# Patient Record
Sex: Female | Born: 1980
Health system: Southern US, Community
[De-identification: ages and names within clinical notes are randomized; demographics above are authoritative.]

## PROBLEM LIST (undated history)

## (undated) DIAGNOSIS — F419 Anxiety disorder, unspecified: Secondary | ICD-10-CM

## (undated) DIAGNOSIS — K589 Irritable bowel syndrome without diarrhea: Secondary | ICD-10-CM

## (undated) DIAGNOSIS — F329 Major depressive disorder, single episode, unspecified: Secondary | ICD-10-CM

## (undated) DIAGNOSIS — K219 Gastro-esophageal reflux disease without esophagitis: Secondary | ICD-10-CM

## (undated) DIAGNOSIS — I10 Essential (primary) hypertension: Secondary | ICD-10-CM

## (undated) DIAGNOSIS — F32A Depression, unspecified: Secondary | ICD-10-CM

## (undated) HISTORY — PX: WISDOM TOOTH EXTRACTION: SHX21

## (undated) HISTORY — PX: ABDOMINAL HYSTERECTOMY: SHX81

## (undated) HISTORY — PX: CHOLECYSTECTOMY: SHX55

## (undated) HISTORY — PX: OOPHORECTOMY: SHX86

---

## 2009-01-08 ENCOUNTER — Ambulatory Visit: Payer: Self-pay | Admitting: Diagnostic Radiology

## 2009-01-08 ENCOUNTER — Emergency Department (HOSPITAL_BASED_OUTPATIENT_CLINIC_OR_DEPARTMENT_OTHER): Admission: EM | Admit: 2009-01-08 | Discharge: 2009-01-09 | Payer: Self-pay | Admitting: Emergency Medicine

## 2009-01-09 ENCOUNTER — Ambulatory Visit: Payer: Self-pay | Admitting: Diagnostic Radiology

## 2009-01-26 ENCOUNTER — Emergency Department (HOSPITAL_BASED_OUTPATIENT_CLINIC_OR_DEPARTMENT_OTHER): Admission: EM | Admit: 2009-01-26 | Discharge: 2009-01-26 | Payer: Self-pay | Admitting: Emergency Medicine

## 2009-04-07 ENCOUNTER — Emergency Department (HOSPITAL_BASED_OUTPATIENT_CLINIC_OR_DEPARTMENT_OTHER): Admission: EM | Admit: 2009-04-07 | Discharge: 2009-04-07 | Payer: Self-pay | Admitting: Emergency Medicine

## 2009-08-25 ENCOUNTER — Emergency Department (HOSPITAL_BASED_OUTPATIENT_CLINIC_OR_DEPARTMENT_OTHER): Admission: EM | Admit: 2009-08-25 | Discharge: 2009-08-25 | Payer: Self-pay | Admitting: Emergency Medicine

## 2010-09-03 LAB — COMPREHENSIVE METABOLIC PANEL
ALT: 17 U/L (ref 0–35)
AST: 19 U/L (ref 0–37)
Alkaline Phosphatase: 69 U/L (ref 39–117)
Creatinine, Ser: 0.7 mg/dL (ref 0.4–1.2)
GFR calc Af Amer: >60 mL/min (ref >60)
GFR calc non Af Amer: >60 mL/min (ref >60)
Glucose, Bld: 94 mg/dL (ref 70–99)
Potassium: 3.6 mEq/L (ref 3.5–5.1)
Sodium: 142 mEq/L (ref 135–145)
Total Protein: 7.3 g/dL (ref 6.0–8.3)

## 2010-09-03 LAB — CBC
Hemoglobin: 14.3 g/dL (ref 12.0–15.0)
MCV: 92.8 fL (ref 78.0–100.0)
Platelets: 274 10*3/uL (ref 150–400)
RBC: 4.5 MIL/uL (ref 3.87–5.11)
RDW: 12 % (ref 11.5–15.5)

## 2010-09-03 LAB — DIFFERENTIAL
Basophils Absolute: 0 10*3/uL (ref 0.0–0.1)
Basophils Relative: 0 % (ref 0–1)
Eosinophils Absolute: 0.2 10*3/uL (ref 0.0–0.7)
Eosinophils Relative: 2 % (ref 0–5)
Monocytes Absolute: 0.6 10*3/uL (ref 0.1–1.0)
Monocytes Relative: 6 % (ref 3–12)
Neutrophils Relative %: 59 % (ref 43–77)

## 2010-09-03 LAB — PREGNANCY, URINE: Preg Test, Ur: NEGATIVE

## 2010-09-03 LAB — URINALYSIS, ROUTINE W REFLEX MICROSCOPIC
Bilirubin Urine: NEGATIVE
Nitrite: NEGATIVE
Protein, ur: NEGATIVE mg/dL
Specific Gravity, Urine: 1.024 (ref 1.005–1.030)
Urobilinogen, UA: 0.2 mg/dL (ref 0.0–1.0)
pH: 5 (ref 5.0–8.0)

## 2010-09-06 LAB — RAPID STREP SCREEN (MED CTR MEBANE ONLY): Streptococcus, Group A Screen (Direct): NEGATIVE

## 2010-09-07 LAB — URINALYSIS, ROUTINE W REFLEX MICROSCOPIC
Glucose, UA: NEGATIVE mg/dL
Hgb urine dipstick: NEGATIVE
Specific Gravity, Urine: 1.043 — ABNORMAL HIGH (ref 1.005–1.030)
pH: 6 (ref 5.0–8.0)

## 2010-09-07 LAB — COMPREHENSIVE METABOLIC PANEL
AST: 18 U/L (ref 0–37)
Albumin: 4.3 g/dL (ref 3.5–5.2)
Alkaline Phosphatase: 82 U/L (ref 39–117)
CO2: 28 mEq/L (ref 19–32)
Calcium: 9.9 mg/dL (ref 8.4–10.5)
Chloride: 106 mEq/L (ref 96–112)
Creatinine, Ser: 0.8 mg/dL (ref 0.4–1.2)
GFR calc non Af Amer: >60 mL/min (ref >60)
Glucose, Bld: 94 mg/dL (ref 70–99)
Total Bilirubin: 0.4 mg/dL (ref 0.3–1.2)
Total Protein: 7.5 g/dL (ref 6.0–8.3)

## 2010-09-07 LAB — CBC
HCT: 41.8 % (ref 36.0–46.0)
Hemoglobin: 14.2 g/dL (ref 12.0–15.0)
MCHC: 34 g/dL (ref 30.0–36.0)
MCV: 91.3 fL (ref 78.0–100.0)
Platelets: 246 10*3/uL (ref 150–400)
RBC: 4.57 MIL/uL (ref 3.87–5.11)
WBC: 11.6 10*3/uL — ABNORMAL HIGH (ref 4.0–10.5)

## 2010-09-07 LAB — DIFFERENTIAL
Basophils Absolute: 0.1 10*3/uL (ref 0.0–0.1)
Basophils Relative: 1 % (ref 0–1)
Eosinophils Absolute: 0.1 10*3/uL (ref 0.0–0.7)
Eosinophils Relative: 1 % (ref 0–5)
Lymphocytes Relative: 35 % (ref 12–46)
Lymphs Abs: 4.1 10*3/uL — ABNORMAL HIGH (ref 0.7–4.0)
Monocytes Absolute: 0.6 10*3/uL (ref 0.1–1.0)
Monocytes Relative: 5 % (ref 3–12)
Neutrophils Relative %: 59 % (ref 43–77)

## 2010-09-07 LAB — LIPASE, BLOOD: Lipase: 156 U/L (ref 23–300)

## 2013-05-29 ENCOUNTER — Encounter (HOSPITAL_BASED_OUTPATIENT_CLINIC_OR_DEPARTMENT_OTHER): Payer: Self-pay | Admitting: Emergency Medicine

## 2013-05-29 ENCOUNTER — Emergency Department (HOSPITAL_BASED_OUTPATIENT_CLINIC_OR_DEPARTMENT_OTHER)
Admission: EM | Admit: 2013-05-29 | Discharge: 2013-05-29 | Disposition: A | Discharge status: Home / Self Care | Payer: Medicare Other | Attending: Emergency Medicine | Admitting: Emergency Medicine

## 2013-05-29 DIAGNOSIS — F411 Generalized anxiety disorder: Secondary | ICD-10-CM | POA: Insufficient documentation

## 2013-05-29 DIAGNOSIS — M26609 Unspecified temporomandibular joint disorder, unspecified side: Secondary | ICD-10-CM | POA: Insufficient documentation

## 2013-05-29 DIAGNOSIS — Z79899 Other long term (current) drug therapy: Secondary | ICD-10-CM | POA: Insufficient documentation

## 2013-05-29 DIAGNOSIS — M26629 Arthralgia of temporomandibular joint, unspecified side: Secondary | ICD-10-CM

## 2013-05-29 DIAGNOSIS — K219 Gastro-esophageal reflux disease without esophagitis: Secondary | ICD-10-CM | POA: Insufficient documentation

## 2013-05-29 DIAGNOSIS — I10 Essential (primary) hypertension: Secondary | ICD-10-CM | POA: Insufficient documentation

## 2013-05-29 HISTORY — DX: Gastro-esophageal reflux disease without esophagitis: K21.9

## 2013-05-29 HISTORY — DX: Irritable bowel syndrome, unspecified: K58.9

## 2013-05-29 HISTORY — DX: Major depressive disorder, single episode, unspecified: F32.9

## 2013-05-29 HISTORY — DX: Depression, unspecified: F32.A

## 2013-05-29 HISTORY — DX: Anxiety disorder, unspecified: F41.9

## 2013-05-29 HISTORY — DX: Essential (primary) hypertension: I10

## 2013-05-29 MED ORDER — IBUPROFEN 800 MG PO TABS: 800.0000 mg | ORAL_TABLET | Freq: Three times a day (TID) | ORAL | Status: DC

## 2013-05-29 NOTE — ED Provider Notes (Signed)
Medical screening examination/treatment/procedure(s) were performed by non-physician practitioner and as supervising physician I was immediately available for consultation/collaboration.  EKG Interpretation   None         Kellan Boehlke, MD 05/29/13 1727 

## 2013-05-29 NOTE — ED Provider Notes (Signed)
CSN: [id number]     Arrival date & time 05/29/13  1527 History   First MD Initiated Contact with Patient 05/29/13 1541     Chief Complaint  Patient presents with  . Otalgia   (Consider location/radiation/quality/duration/timing/severity/associated sxs/prior Treatment) HPI Comments: Pt states that she is having right sided ear pain for 3 days;denies fever, congestion:has used some otc medications without relief  The history is provided by the patient. No language interpreter was used.    Past Medical History  Diagnosis Date  . IBS (irritable bowel syndrome)   . Hypertension   . GERD (gastroesophageal reflux disease)   . Depression   . Anxiety    Past Surgical History  Procedure Laterality Date  . Cholecystectomy    . Abdominal hysterectomy    . Wisdom tooth extraction     No family history on file. History  Substance Use Topics  . Smoking status: Never Smoker   . Smokeless tobacco: Not on file  . Alcohol Use: No   OB History   Grav Para Term Preterm Abortions TAB SAB Ect Mult Living                 Review of Systems  Constitutional: Negative.   HENT: Positive for ear pain.   Respiratory: Negative.   Cardiovascular: Negative.     Allergies  Review of patient's allergies indicates no known allergies.  Home Medications   Current Outpatient Rx  Name  Route  Sig  Dispense  Refill  . ClonazePAM (KLONOPIN PO)   Oral   Take by mouth.         . DOXEPIN HCL PO   Oral   Take by mouth.         . Misc Natural Products (APPLE CIDER VINEGAR) TABS   Oral   Take by mouth.         . Nebivolol HCl (BYSTOLIC PO)   Oral   Take by mouth.         . OMEPRAZOLE PO   Oral   Take by mouth.          BP 111/61  Pulse 61  Temp(Src) 97.6 F (36.4 C) (Oral)  Resp 16  Ht 4\' 10"  (1.473 m)  Wt 120 lb (54.432 kg)  BMI 25.09 kg/m2  SpO2 100% Physical Exam  Nursing note and vitals reviewed. Constitutional: She appears well-developed and well-nourished.  HENT:   Head: Normocephalic and atraumatic.  Right Ear: External ear normal.  Left Ear: External ear normal.  Tenderness over the tmj joint:pt has tm redness or inflammation:no external abnormality noted  Eyes: Conjunctivae and EOM are normal. Pupils are equal, round, and reactive to light.  Cardiovascular: Normal rate and regular rhythm.   Pulmonary/Chest: Effort normal and breath sounds normal.    ED Course  Procedures (including critical care time) Labs Review Labs Reviewed - No data to display Imaging Review No results found.  EKG Interpretation   None       MDM   1. TMJ tenderness    Will treat and have follow up with dentistry    Teressa Lower, NP 05/29/13 1603

## 2013-05-29 NOTE — ED Notes (Signed)
Right ear ache since 12/26

## 2014-10-14 ENCOUNTER — Emergency Department (HOSPITAL_BASED_OUTPATIENT_CLINIC_OR_DEPARTMENT_OTHER): Payer: Medicare Other

## 2014-10-14 ENCOUNTER — Emergency Department (HOSPITAL_BASED_OUTPATIENT_CLINIC_OR_DEPARTMENT_OTHER)
Admission: EM | Admit: 2014-10-14 | Discharge: 2014-10-14 | Disposition: A | Discharge status: Home / Self Care | Payer: Medicare Other | Attending: Emergency Medicine | Admitting: Emergency Medicine

## 2014-10-14 ENCOUNTER — Encounter (HOSPITAL_BASED_OUTPATIENT_CLINIC_OR_DEPARTMENT_OTHER): Payer: Self-pay | Admitting: Emergency Medicine

## 2014-10-14 DIAGNOSIS — Z3202 Encounter for pregnancy test, result negative: Secondary | ICD-10-CM | POA: Insufficient documentation

## 2014-10-14 DIAGNOSIS — Z91040 Latex allergy status: Secondary | ICD-10-CM | POA: Insufficient documentation

## 2014-10-14 DIAGNOSIS — K219 Gastro-esophageal reflux disease without esophagitis: Secondary | ICD-10-CM | POA: Diagnosis not present

## 2014-10-14 DIAGNOSIS — Z791 Long term (current) use of non-steroidal anti-inflammatories (NSAID): Secondary | ICD-10-CM | POA: Insufficient documentation

## 2014-10-14 DIAGNOSIS — Z9049 Acquired absence of other specified parts of digestive tract: Secondary | ICD-10-CM | POA: Diagnosis not present

## 2014-10-14 DIAGNOSIS — R1032 Left lower quadrant pain: Secondary | ICD-10-CM | POA: Diagnosis present

## 2014-10-14 DIAGNOSIS — F419 Anxiety disorder, unspecified: Secondary | ICD-10-CM | POA: Insufficient documentation

## 2014-10-14 DIAGNOSIS — F329 Major depressive disorder, single episode, unspecified: Secondary | ICD-10-CM | POA: Insufficient documentation

## 2014-10-14 DIAGNOSIS — N390 Urinary tract infection, site not specified: Secondary | ICD-10-CM | POA: Insufficient documentation

## 2014-10-14 DIAGNOSIS — K859 Acute pancreatitis, unspecified: Secondary | ICD-10-CM | POA: Diagnosis not present

## 2014-10-14 DIAGNOSIS — Z90710 Acquired absence of both cervix and uterus: Secondary | ICD-10-CM | POA: Diagnosis not present

## 2014-10-14 DIAGNOSIS — I10 Essential (primary) hypertension: Secondary | ICD-10-CM | POA: Diagnosis not present

## 2014-10-14 DIAGNOSIS — Z79899 Other long term (current) drug therapy: Secondary | ICD-10-CM | POA: Diagnosis not present

## 2014-10-14 LAB — COMPREHENSIVE METABOLIC PANEL
ALT: 11 U/L — AB (ref 14–54)
ANION GAP: 7 (ref 5–15)
AST: 16 U/L (ref 15–41)
Albumin: 4.3 g/dL (ref 3.5–5.0)
Alkaline Phosphatase: 67 U/L (ref 38–126)
BUN: 15 mg/dL (ref 6–20)
CALCIUM: 9.2 mg/dL (ref 8.9–10.3)
CO2: 24 mmol/L (ref 22–32)
Chloride: 108 mmol/L (ref 101–111)
Creatinine, Ser: 0.7 mg/dL (ref 0.44–1.00)
Glucose, Bld: 92 mg/dL (ref 65–99)
Potassium: 3.9 mmol/L (ref 3.5–5.1)
Sodium: 139 mmol/L (ref 135–145)
Total Bilirubin: 0.6 mg/dL (ref 0.3–1.2)
Total Protein: 7.1 g/dL (ref 6.5–8.1)

## 2014-10-14 LAB — URINALYSIS, ROUTINE W REFLEX MICROSCOPIC
Bilirubin Urine: NEGATIVE
GLUCOSE, UA: NEGATIVE mg/dL
HGB URINE DIPSTICK: NEGATIVE
Ketones, ur: NEGATIVE mg/dL
NITRITE: NEGATIVE
PROTEIN: NEGATIVE mg/dL
Specific Gravity, Urine: 1.012 (ref 1.005–1.030)
Urobilinogen, UA: 1 mg/dL (ref 0.0–1.0)
pH: 7.5 (ref 5.0–8.0)

## 2014-10-14 LAB — URINE MICROSCOPIC-ADD ON

## 2014-10-14 LAB — CBC WITH DIFFERENTIAL/PLATELET
Basophils Absolute: 0 10*3/uL (ref 0.0–0.1)
Basophils Relative: 0 % (ref 0–1)
EOS ABS: 0.1 10*3/uL (ref 0.0–0.7)
EOS PCT: 2 % (ref 0–5)
HCT: 39.1 % (ref 36.0–46.0)
Hemoglobin: 13.2 g/dL (ref 12.0–15.0)
Lymphocytes Relative: 36 % (ref 12–46)
Lymphs Abs: 2.3 10*3/uL (ref 0.7–4.0)
MCH: 30.4 pg (ref 26.0–34.0)
MCHC: 33.8 g/dL (ref 30.0–36.0)
MCV: 90.1 fL (ref 78.0–100.0)
MONO ABS: 0.6 10*3/uL (ref 0.1–1.0)
Monocytes Relative: 9 % (ref 3–12)
NEUTROS PCT: 53 % (ref 43–77)
Neutro Abs: 3.5 10*3/uL (ref 1.7–7.7)
PLATELETS: 205 10*3/uL (ref 150–400)
RBC: 4.34 MIL/uL (ref 3.87–5.11)
RDW: 12.2 % (ref 11.5–15.5)
WBC: 6.5 10*3/uL (ref 4.0–10.5)

## 2014-10-14 LAB — PREGNANCY, URINE: PREG TEST UR: NEGATIVE

## 2014-10-14 LAB — LIPASE, BLOOD: Lipase: 703 U/L — ABNORMAL HIGH (ref 22–51)

## 2014-10-14 MED ORDER — ONDANSETRON 4 MG PO TBDP: ORAL_TABLET | ORAL | Status: DC

## 2014-10-14 MED ORDER — HYDROCODONE-ACETAMINOPHEN 5-325 MG PO TABS: 2.0000 | ORAL_TABLET | ORAL | Status: DC | PRN

## 2014-10-14 MED ORDER — IOHEXOL 300 MG/ML  SOLN
50.0000 mL | Freq: Once | INTRAMUSCULAR | Status: AC | PRN
  Administered 2014-10-14: 50 mL via ORAL

## 2014-10-14 MED ORDER — FENTANYL CITRATE (PF) 100 MCG/2ML IJ SOLN
50.0000 ug | Freq: Once | INTRAMUSCULAR | Status: AC
  Administered 2014-10-14: 50 ug via INTRAVENOUS
  Filled 2014-10-14: qty 2

## 2014-10-14 MED ORDER — SODIUM CHLORIDE 0.9 % IV BOLUS (SEPSIS)
1000.0000 mL | Freq: Once | INTRAVENOUS | Status: AC
  Administered 2014-10-14: 1000 mL via INTRAVENOUS

## 2014-10-14 MED ORDER — CEPHALEXIN 500 MG PO CAPS: 500.0000 mg | ORAL_CAPSULE | Freq: Four times a day (QID) | ORAL | Status: DC

## 2014-10-14 MED ORDER — IOHEXOL 300 MG/ML  SOLN
100.0000 mL | Freq: Once | INTRAMUSCULAR | Status: AC | PRN
  Administered 2014-10-14: 100 mL via INTRAVENOUS

## 2014-10-14 NOTE — Discharge Instructions (Signed)
You were evaluated in the ED today for your abdominal pain. Your found to have acute pancreatitis as well as a UTI. You'll be given antibiotics for UTI, pain medicines and antinausea medicines for your pancreatitis. It is important for you to follow a clear liquid diet and avoid alcohol at this time. Please follow-up with [facility] for further evaluation and management of your symptoms. Return to ED for new or worsening symptoms.  Acute Pancreatitis Acute pancreatitis is a disease in which the pancreas becomes suddenly inflamed. The pancreas is a large gland located behind your stomach. The pancreas produces enzymes that help digest food. The pancreas also releases the hormones glucagon and insulin that help regulate blood sugar. Damage to the pancreas occurs when the digestive enzymes from the pancreas are activated and begin attacking the pancreas before being released into the intestine. Most acute attacks last a couple of days and can cause serious complications. Some people become dehydrated and develop low blood pressure. In severe cases, bleeding into the pancreas can lead to shock and can be life-threatening. The lungs, heart, and kidneys may fail. CAUSES  Pancreatitis can happen to anyone. In some cases, the cause is unknown. Most cases are caused by:  Alcohol abuse.  Gallstones. Other less common causes are:  Certain medicines.  Exposure to certain chemicals.  Infection.  Damage caused by an accident (trauma).  Abdominal surgery. SYMPTOMS   Pain in the upper abdomen that may radiate to the back.  Tenderness and swelling of the abdomen.  Nausea and vomiting. DIAGNOSIS  Your caregiver will perform a physical exam. Blood and stool tests may be done to confirm the diagnosis. Imaging tests may also be done, such as X-rays, CT scans, or an ultrasound of the abdomen. TREATMENT  Treatment usually requires a stay in the hospital. Treatment may include:  Pain  medicine.  Fluid replacement through an intravenous line (IV).  Placing a tube in the stomach to remove stomach contents and control vomiting.  Not eating for 3 or 4 days. This gives your pancreas a rest, because enzymes are not being produced that can cause further damage.  Antibiotic medicines if your condition is caused by an infection.  Surgery of the pancreas or gallbladder. HOME CARE INSTRUCTIONS   Follow the diet advised by your caregiver. This may involve avoiding alcohol and decreasing the amount of fat in your diet.  Eat smaller, more frequent meals. This reduces the amount of digestive juices the pancreas produces.  Drink enough fluids to keep your urine clear or pale yellow.  Only take over-the-counter or prescription medicines as directed by your caregiver.  Avoid drinking alcohol if it caused your condition.  Do not smoke.  Get plenty of rest.  Check your blood sugar at home as directed by your caregiver.  Keep all follow-up appointments as directed by your caregiver. SEEK MEDICAL CARE IF:   You do not recover as quickly as expected.  You develop new or worsening symptoms.  You have persistent pain, weakness, or nausea.  You recover and then have another episode of pain. SEEK IMMEDIATE MEDICAL CARE IF:   You are unable to eat or keep fluids down.  Your pain becomes severe.  You have a fever or persistent symptoms for more than 2 to 3 days.  You have a fever and your symptoms suddenly get worse.  Your skin or the white part of your eyes turn yellow (jaundice).  You develop vomiting.  You feel dizzy, or you  faint.  Your blood sugar is high (over 300 mg/dL). MAKE SURE YOU:   Understand these instructions.  Will watch your condition.  Will get help right away if you are not doing well or get worse. Document Released: 05/18/2005 Document Revised: 11/17/2011 Document Reviewed: 08/27/2011 [facility] Patient Information 2015 [place], [place]. This  information is not intended to replace advice given to you by your health care provider. Make sure you discuss any questions you have with your health care provider.  Urinary Tract Infection Urinary tract infections (UTIs) can develop anywhere along your urinary tract. Your urinary tract is your body's drainage system for removing wastes and extra water. Your urinary tract includes two kidneys, two ureters, a bladder, and a urethra. Your kidneys are a pair of bean-shaped organs. Each kidney is about the size of your fist. They are located below your ribs, one on each side of your spine. CAUSES Infections are caused by microbes, which are microscopic organisms, including fungi, viruses, and bacteria. These organisms are so small that they can only be seen through a microscope. Bacteria are the microbes that most commonly cause UTIs. SYMPTOMS  Symptoms of UTIs may vary by age and gender of the patient and by the location of the infection. Symptoms in young women typically include a frequent and intense urge to urinate and a painful, burning feeling in the bladder or urethra during urination. Older women and men are more likely to be tired, shaky, and weak and have muscle aches and abdominal pain. A fever may mean the infection is in your kidneys. Other symptoms of a kidney infection include pain in your back or sides below the ribs, nausea, and vomiting. DIAGNOSIS To diagnose a UTI, your caregiver will ask you about your symptoms. Your caregiver also will ask to provide a urine sample. The urine sample will be tested for bacteria and white blood cells. White blood cells are made by your body to help fight infection. TREATMENT  Typically, UTIs can be treated with medication. Because most UTIs are caused by a bacterial infection, they usually can be treated with the use of antibiotics. The choice of antibiotic and length of treatment depend on your symptoms and the type of bacteria causing your  infection. HOME CARE INSTRUCTIONS  If you were prescribed antibiotics, take them exactly as your caregiver instructs you. Finish the medication even if you feel better after you have only taken some of the medication.  Drink enough water and fluids to keep your urine clear or pale yellow.  Avoid caffeine, tea, and carbonated beverages. They tend to irritate your bladder.  Empty your bladder often. Avoid holding urine for long periods of time.  Empty your bladder before and after sexual intercourse.  After a bowel movement, women should cleanse from front to back. Use each tissue only once. SEEK MEDICAL CARE IF:   You have back pain.  You develop a fever.  Your symptoms do not begin to resolve within 3 days. SEEK IMMEDIATE MEDICAL CARE IF:   You have severe back pain or lower abdominal pain.  You develop chills.  You have nausea or vomiting.  You have continued burning or discomfort with urination. MAKE SURE YOU:   Understand these instructions.  Will watch your condition.  Will get help right away if you are not doing well or get worse. Document Released: 02/25/2005 Document Revised: 11/17/2011 Document Reviewed: 06/26/2011 [facility]ExitCare Patient Information 2015 [place]Care, [place]. This information is not intended to replace advice given to you by  your health care provider. Make sure you discuss any questions you have with your health care provider. ° °

## 2014-10-14 NOTE — ED Notes (Signed)
Patient reports having sharp abdominal pain in the LLQ which began 4 days ago.  Denies N/V.  Reports hx of IBS and has had "some diarrhea".  Reports the pain began upper mid abdomen and has since radiated to LLQ last night.  Reports pain increases with walking.

## 2014-10-14 NOTE — ED Provider Notes (Signed)
CSN: [id number]     Arrival date & time 10/14/14  1155 History   First MD Initiated Contact with Patient 10/14/14 1200     Chief Complaint  Patient presents with  . Abdominal Pain     (Consider location/radiation/quality/duration/timing/severity/associated sxs/prior Treatment) HPI Carla Jackson is a 34 y.o. female with history of IBS, status post total abdominal hysterectomy, comes in for evaluation of lower left quadrant abdominal pain. Patient states she had a "sharp stabbing pain right above my belly button" proximally 4 days ago that has gradually gotten worse. She has taken Aleve without relief. She denies any nausea, vomiting, fevers, chills, urinary symptoms, dark or bloody stools. Reports last bowel movement was yesterday and was normal for her. This discomfort does not feel similar to her IBS discomfort. Symptoms feel better with lying flat and curled up and worsened with walking and certain movements.  Past Medical History  Diagnosis Date  . IBS (irritable bowel syndrome)   . Hypertension   . GERD (gastroesophageal reflux disease)   . Depression   . Anxiety    Past Surgical History  Procedure Laterality Date  . Cholecystectomy    . Abdominal hysterectomy    . Wisdom tooth extraction     History reviewed. No pertinent family history. History  Substance Use Topics  . Smoking status: Never Smoker   . Smokeless tobacco: Not on file  . Alcohol Use: No   OB History    No data available     Review of Systems A 10 point review of systems was completed and was negative except for pertinent positives and negatives as mentioned in the history of present illness     Allergies  Erythromycin and Latex  Home Medications   Prior to Admission medications   Medication Sig Start Date End Date Taking? Authorizing Provider  cephALEXin (KEFLEX) 500 MG capsule Take 1 capsule (500 mg total) by mouth 4 (four) times daily. 10/14/14   Joycie PeekBenjamin Kimba Lottes, PA-C  ClonazePAM (KLONOPIN  PO) Take by mouth.    Historical Provider, MD  DOXEPIN HCL PO Take by mouth.    Historical Provider, MD  HYDROcodone-acetaminophen (NORCO) 5-325 MG per tablet Take 2 tablets by mouth every 4 (four) hours as needed. 10/14/14   Joycie PeekBenjamin Kedric Bumgarner, PA-C  ibuprofen (ADVIL,MOTRIN) 800 MG tablet Take 1 tablet (800 mg total) by mouth 3 (three) times daily. 05/29/13   Teressa LowerVrinda Pickering, NP  Misc Natural Products (APPLE CIDER VINEGAR) TABS Take by mouth.    Historical Provider, MD  Nebivolol HCl (BYSTOLIC PO) Take by mouth.    Historical Provider, MD  OMEPRAZOLE PO Take by mouth.    Historical Provider, MD  ondansetron (ZOFRAN ODT) 4 MG disintegrating tablet 4mg  ODT q4 hours prn nausea/vomit 10/14/14   Oliverio Cho, PA-C   BP 132/70 mmHg  Pulse 78  Temp(Src) 98.5 F (36.9 C) (Oral)  Resp 16  Ht 4\' 10"  (1.473 m)  Wt 120 lb (54.432 kg)  BMI 25.09 kg/m2  SpO2 100% Physical Exam  Constitutional: She is oriented to person, place, and time. She appears well-developed and well-nourished.  HENT:  Head: Normocephalic and atraumatic.  Mouth/Throat: Oropharynx is clear and moist.  Eyes: Conjunctivae are normal. Pupils are equal, round, and reactive to light. Right eye exhibits no discharge. Left eye exhibits no discharge. No scleral icterus.  Neck: Neck supple.  Cardiovascular: Normal rate, regular rhythm and normal heart sounds.   Pulmonary/Chest: Effort normal and breath sounds normal. No respiratory distress. She has no wheezes.  She has no rales.  Abdominal: Soft. There is no tenderness.  Exquisite tenderness to palpation in epigastrium, periumbilical region as well as lower left quadrant. Abdomen is soft, nondistended. Patient does express some guarding. No rebound. No lesions or deformities. No palpable masses.  Musculoskeletal: She exhibits no tenderness.  Neurological: She is alert and oriented to person, place, and time.  Cranial Nerves II-XII grossly intact  Skin: Skin is warm and dry. No rash  noted.  Psychiatric: She has a normal mood and affect.  Nursing note and vitals reviewed.   ED Course  Procedures (including critical care time) Labs Review Labs Reviewed  COMPREHENSIVE METABOLIC PANEL - Abnormal; Notable for the following:    ALT 11 (*)    All other components within normal limits  LIPASE, BLOOD - Abnormal; Notable for the following:    Lipase 703 (*)    All other components within normal limits  URINALYSIS, ROUTINE W REFLEX MICROSCOPIC - Abnormal; Notable for the following:    Leukocytes, UA LARGE (*)    All other components within normal limits  URINE MICROSCOPIC-ADD ON - Abnormal; Notable for the following:    Bacteria, UA MANY (*)    All other components within normal limits  CBC WITH DIFFERENTIAL/PLATELET  PREGNANCY, URINE    Imaging Review Ct Abdomen Pelvis W Contrast  10/14/2014   CLINICAL DATA:  Left lower quadrant abdomen pain for 4 days. History of inflammatory bowel disease.  EXAM: CT ABDOMEN AND PELVIS WITH CONTRAST  TECHNIQUE: Multidetector CT imaging of the abdomen and pelvis was performed using the standard protocol following bolus administration of intravenous contrast.  CONTRAST:  50mL OMNIPAQUE IOHEXOL 300 MG/ML SOLN, 100mL OMNIPAQUE IOHEXOL 300 MG/ML SOLN  COMPARISON:  January 08, 2009  FINDINGS: The liver, spleen, pancreas, adrenal glands are normal. The patient is status post prior cholecystectomy. There is a 3 mm cyst in the lower pole right kidney. The left kidney is normal. There is no hydronephrosis bilaterally. The aorta is normal. There is no abdominal lymphadenopathy.  There is no small bowel obstruction or diverticulitis. The appendix not seen but no inflammation is noted around the cecum.  Fluid-filled bladder is normal. The patient is status post prior hysterectomy. Minimal free fluid is identified in the pelvis. Small cysts are identified in normal size bilateral ovaries. The visualized lung bases are clear. No acute abnormalities identified  within the visualized bones.  IMPRESSION: No acute abnormality identified in the abdomen and pelvis. There is no evidence of bowel obstruction or diverticulitis.   Electronically Signed   By: Sherian ReinWei-Chen  Lin M.D.   On: 10/14/2014 15:17     EKG Interpretation None      MDM  Vitals stable - WNL -afebrile Pt resting comfortably in ED. PE--epigastric and lower left quadrant tenderness that is improving after administration of IV fluids and analgesia. Labwork--lipase 703, evidence of UTI on urinalysis. Labs otherwise noncontributory. Imaging-CT abdomen shows no acute intra-abdominal pathology.  DDX--patient is stable, nontoxic in appearance is that she feels much better after IV fluids and pain medicines in the ED. I feel patient is stable to be discharged home to follow-up outpatient with [facility]. Will DC with instructions for clear fluid diet, pain medicines, antiemetics. Antibiotics for UTI.  I discussed all relevant lab findings and imaging results with pt and they verbalized understanding. Discussed f/u with PCP within 48 hrs and return precautions, pt very amenable to plan. Prior to patient discharge, I discussed and reviewed this case with  Dr. Criss Alvine     Final diagnoses:  Acute pancreatitis, unspecified pancreatitis type  UTI (lower urinary tract infection)        Joycie Peek, PA-C 10/15/14 0028  Eber Hong, MD 10/17/14 [phone]

## 2015-02-26 ENCOUNTER — Other Ambulatory Visit (HOSPITAL_BASED_OUTPATIENT_CLINIC_OR_DEPARTMENT_OTHER): Payer: Self-pay | Admitting: Physician Assistant

## 2015-02-26 DIAGNOSIS — R319 Hematuria, unspecified: Secondary | ICD-10-CM

## 2015-02-26 DIAGNOSIS — R1032 Left lower quadrant pain: Secondary | ICD-10-CM

## 2015-02-28 ENCOUNTER — Ambulatory Visit (HOSPITAL_BASED_OUTPATIENT_CLINIC_OR_DEPARTMENT_OTHER)
Admission: RE | Admit: 2015-02-28 | Discharge: 2015-02-28 | Disposition: A | Discharge status: Home / Self Care | Payer: Medicare Other | Source: Ambulatory Visit | Attending: Physician Assistant | Admitting: Physician Assistant

## 2015-02-28 DIAGNOSIS — Z90710 Acquired absence of both cervix and uterus: Secondary | ICD-10-CM | POA: Diagnosis not present

## 2015-02-28 DIAGNOSIS — R109 Unspecified abdominal pain: Secondary | ICD-10-CM | POA: Diagnosis present

## 2015-02-28 DIAGNOSIS — Z9049 Acquired absence of other specified parts of digestive tract: Secondary | ICD-10-CM | POA: Diagnosis not present

## 2015-02-28 DIAGNOSIS — R319 Hematuria, unspecified: Secondary | ICD-10-CM

## 2015-02-28 DIAGNOSIS — R1032 Left lower quadrant pain: Secondary | ICD-10-CM

## 2015-03-07 ENCOUNTER — Other Ambulatory Visit (HOSPITAL_BASED_OUTPATIENT_CLINIC_OR_DEPARTMENT_OTHER): Payer: Self-pay | Admitting: Physician Assistant

## 2015-03-07 DIAGNOSIS — R1032 Left lower quadrant pain: Secondary | ICD-10-CM

## 2015-03-08 ENCOUNTER — Ambulatory Visit (HOSPITAL_BASED_OUTPATIENT_CLINIC_OR_DEPARTMENT_OTHER): Admission: RE | Admit: 2015-03-08 | Payer: Medicare Other | Source: Ambulatory Visit

## 2015-03-08 ENCOUNTER — Ambulatory Visit (HOSPITAL_BASED_OUTPATIENT_CLINIC_OR_DEPARTMENT_OTHER)
Admission: RE | Admit: 2015-03-08 | Discharge: 2015-03-08 | Disposition: A | Discharge status: Home / Self Care | Payer: Medicare Other | Source: Ambulatory Visit | Attending: Physician Assistant | Admitting: Physician Assistant

## 2015-03-08 DIAGNOSIS — R112 Nausea with vomiting, unspecified: Secondary | ICD-10-CM | POA: Insufficient documentation

## 2015-03-08 DIAGNOSIS — N941 Unspecified dyspareunia: Secondary | ICD-10-CM | POA: Diagnosis not present

## 2015-03-08 DIAGNOSIS — R1032 Left lower quadrant pain: Secondary | ICD-10-CM | POA: Diagnosis not present

## 2015-03-08 DIAGNOSIS — Z90710 Acquired absence of both cervix and uterus: Secondary | ICD-10-CM | POA: Insufficient documentation

## 2015-03-11 ENCOUNTER — Emergency Department (HOSPITAL_BASED_OUTPATIENT_CLINIC_OR_DEPARTMENT_OTHER)
Admission: EM | Admit: 2015-03-11 | Discharge: 2015-03-11 | Disposition: A | Discharge status: Home / Self Care | Payer: Medicare Other | Attending: Emergency Medicine | Admitting: Emergency Medicine

## 2015-03-11 ENCOUNTER — Encounter (HOSPITAL_BASED_OUTPATIENT_CLINIC_OR_DEPARTMENT_OTHER): Payer: Self-pay | Admitting: *Deleted

## 2015-03-11 DIAGNOSIS — I10 Essential (primary) hypertension: Secondary | ICD-10-CM | POA: Diagnosis not present

## 2015-03-11 DIAGNOSIS — Z792 Long term (current) use of antibiotics: Secondary | ICD-10-CM | POA: Insufficient documentation

## 2015-03-11 DIAGNOSIS — R110 Nausea: Secondary | ICD-10-CM | POA: Diagnosis not present

## 2015-03-11 DIAGNOSIS — Z791 Long term (current) use of non-steroidal anti-inflammatories (NSAID): Secondary | ICD-10-CM | POA: Diagnosis not present

## 2015-03-11 DIAGNOSIS — K219 Gastro-esophageal reflux disease without esophagitis: Secondary | ICD-10-CM | POA: Diagnosis not present

## 2015-03-11 DIAGNOSIS — M542 Cervicalgia: Secondary | ICD-10-CM | POA: Insufficient documentation

## 2015-03-11 DIAGNOSIS — R109 Unspecified abdominal pain: Secondary | ICD-10-CM | POA: Diagnosis present

## 2015-03-11 DIAGNOSIS — Z91040 Latex allergy status: Secondary | ICD-10-CM | POA: Insufficient documentation

## 2015-03-11 DIAGNOSIS — M549 Dorsalgia, unspecified: Secondary | ICD-10-CM | POA: Diagnosis not present

## 2015-03-11 DIAGNOSIS — F419 Anxiety disorder, unspecified: Secondary | ICD-10-CM | POA: Diagnosis not present

## 2015-03-11 DIAGNOSIS — F329 Major depressive disorder, single episode, unspecified: Secondary | ICD-10-CM | POA: Diagnosis not present

## 2015-03-11 DIAGNOSIS — N939 Abnormal uterine and vaginal bleeding, unspecified: Secondary | ICD-10-CM | POA: Insufficient documentation

## 2015-03-11 LAB — CBC WITH DIFFERENTIAL/PLATELET
Basophils Absolute: 0 10*3/uL (ref 0.0–0.1)
Basophils Relative: 1 %
Eosinophils Absolute: 0.2 10*3/uL (ref 0.0–0.7)
Eosinophils Relative: 2 %
HCT: 37.3 % (ref 36.0–46.0)
Hemoglobin: 12.6 g/dL (ref 12.0–15.0)
LYMPHS ABS: 4.3 10*3/uL — AB (ref 0.7–4.0)
LYMPHS PCT: 48 %
MCH: 30.2 pg (ref 26.0–34.0)
MCHC: 33.8 g/dL (ref 30.0–36.0)
MCV: 89.4 fL (ref 78.0–100.0)
MONOS PCT: 8 %
Monocytes Absolute: 0.7 10*3/uL (ref 0.1–1.0)
NEUTROS PCT: 41 %
Neutro Abs: 3.7 10*3/uL (ref 1.7–7.7)
Platelets: 231 10*3/uL (ref 150–400)
RBC: 4.17 MIL/uL (ref 3.87–5.11)
RDW: 11.8 % (ref 11.5–15.5)
WBC: 8.9 10*3/uL (ref 4.0–10.5)

## 2015-03-11 LAB — URINE MICROSCOPIC-ADD ON

## 2015-03-11 LAB — COMPREHENSIVE METABOLIC PANEL
ALK PHOS: 62 U/L (ref 38–126)
ALT: 15 U/L (ref 14–54)
ANION GAP: 8 (ref 5–15)
AST: 21 U/L (ref 15–41)
Albumin: 4.3 g/dL (ref 3.5–5.0)
BILIRUBIN TOTAL: 0.4 mg/dL (ref 0.3–1.2)
BUN: 9 mg/dL (ref 6–20)
CALCIUM: 9.1 mg/dL (ref 8.9–10.3)
CO2: 28 mmol/L (ref 22–32)
CREATININE: 0.8 mg/dL (ref 0.44–1.00)
Chloride: 104 mmol/L (ref 101–111)
GFR calc Af Amer: >60 mL/min (ref >60)
GFR calc non Af Amer: >60 mL/min (ref >60)
GLUCOSE: 97 mg/dL (ref 65–99)
Potassium: 3.4 mmol/L — ABNORMAL LOW (ref 3.5–5.1)
Sodium: 140 mmol/L (ref 135–145)
Total Protein: 7.2 g/dL (ref 6.5–8.1)

## 2015-03-11 LAB — URINALYSIS, ROUTINE W REFLEX MICROSCOPIC
BILIRUBIN URINE: NEGATIVE
GLUCOSE, UA: NEGATIVE mg/dL
HGB URINE DIPSTICK: NEGATIVE
Ketones, ur: NEGATIVE mg/dL
Nitrite: NEGATIVE
PH: 7.5 (ref 5.0–8.0)
Protein, ur: NEGATIVE mg/dL
SPECIFIC GRAVITY, URINE: 1.002 — AB (ref 1.005–1.030)
Urobilinogen, UA: 0.2 mg/dL (ref 0.0–1.0)

## 2015-03-11 LAB — LIPASE, BLOOD: Lipase: 34 U/L (ref 22–51)

## 2015-03-11 MED ORDER — PROMETHAZINE HCL 25 MG/ML IJ SOLN
12.5000 mg | Freq: Once | INTRAMUSCULAR | Status: AC
Start: 2015-03-11 — End: 2015-03-11
  Administered 2015-03-11: 12.5 mg via INTRAVENOUS
  Filled 2015-03-11: qty 1

## 2015-03-11 MED ORDER — SODIUM CHLORIDE 0.9 % IV BOLUS (SEPSIS)
1000.0000 mL | Freq: Once | INTRAVENOUS | Status: AC
  Administered 2015-03-11: 1000 mL via INTRAVENOUS

## 2015-03-11 MED ORDER — KETOROLAC TROMETHAMINE 30 MG/ML IJ SOLN
30.0000 mg | Freq: Once | INTRAMUSCULAR | Status: AC
  Administered 2015-03-11: 30 mg via INTRAVENOUS
  Filled 2015-03-11: qty 1

## 2015-03-11 MED ORDER — PANTOPRAZOLE SODIUM 40 MG IV SOLR
40.0000 mg | Freq: Once | INTRAVENOUS | Status: AC
  Administered 2015-03-11: 40 mg via INTRAVENOUS
  Filled 2015-03-11: qty 40

## 2015-03-11 MED ORDER — SODIUM CHLORIDE 0.9 % IV SOLN: Freq: Once | INTRAVENOUS | Status: DC

## 2015-03-11 NOTE — ED Notes (Signed)
Abdominal pain for a month. All of her test are normal.

## 2015-03-11 NOTE — Discharge Instructions (Signed)
Abdominal Pain, Adult  The cause of your abdominal pain is unclear at this time but it does not appear to be life threatening.  Follow up outpatient as soon as possible with your primary care physician for continued work up.  Many things can cause abdominal pain. Usually, abdominal pain is not caused by a disease and will improve without treatment. It can often be observed and treated at home. Your health care provider will do a physical exam and possibly order blood tests and X-rays to help determine the seriousness of your pain. However, in many cases, more time must pass before a clear cause of the pain can be found. Before that point, your health care provider may not know if you need more testing or further treatment. HOME CARE INSTRUCTIONS Monitor your abdominal pain for any changes. The following actions may help to alleviate any discomfort you are experiencing:  Only take over-the-counter or prescription medicines as directed by your health care provider.  Do not take laxatives unless directed to do so by your health care provider.  Try a clear liquid diet (broth, tea, or water) as directed by your health care provider. Slowly move to a bland diet as tolerated. SEEK MEDICAL CARE IF:  You have unexplained abdominal pain.  You have abdominal pain associated with nausea or diarrhea.  You have pain when you urinate or have a bowel movement.  You experience abdominal pain that wakes you in the night.  You have abdominal pain that is worsened or improved by eating food.  You have abdominal pain that is worsened with eating fatty foods.  You have a fever. SEEK IMMEDIATE MEDICAL CARE IF:  Your pain does not go away within 2 hours.  You keep throwing up (vomiting).  Your pain is felt only in portions of the abdomen, such as the right side or the left lower portion of the abdomen.  You pass bloody or black tarry stools. MAKE SURE YOU:  Understand these instructions.  Will watch  your condition.  Will get help right away if you are not doing well or get worse.   This information is not intended to replace advice given to you by your health care provider. Make sure you discuss any questions you have with your health care provider.   Document Released: 02/25/2005 Document Revised: 02/06/2015 Document Reviewed: 01/25/2013 [employer] Interactive Patient Education [employer]

## 2015-03-11 NOTE — ED Provider Notes (Signed)
CSN: [id number]   Arrival date & time 03/11/15 1721  History  By signing my name below, I, Carla Jackson, attest that this documentation has been prepared under the direction and in the presence of Carla Baptist, MD. Electronically Signed: Bethel Jackson, ED Scribe. 03/11/2015. 8:07 PM.  Chief Complaint  Patient presents with  . Abdominal Pain    HPI The history is provided by the patient. No language interpreter was used.   Carla Jackson is a 34 y.o. female with PMHx of IBS and GERD who presents to the Emergency Department complaining of intermittent and severe left-sided abdominal pain with onset 1 month ago. Initially she associated the pain with a known left-sided ovarian cyst but that pain is typically more transient and associated with intercourse. Her last sexual contact was 2 months ago.  She has been seen for the pain and had a CT scan that was negative for kidney stones, a negative [place], and negative STD screening. She was treated for a UTI but the pain has persisted.  Associated symptoms include left lower back pain, intermittent left neck pain, vaginal bleeding (despite history of hysterectomy) since pelvic US, fatigue, and nausea. Pt denies shoulder pain, SOB, chest pain, and rash. Patient was also tested for STDs by PCP and told this was negative.  Has not had sexual intercourse of any kind in over two months.  Past Medical History  Diagnosis Date  . IBS (irritable bowel syndrome)   . Hypertension   . GERD (gastroesophageal reflux disease)   . Depression   . Anxiety     Past Surgical History  Procedure Laterality Date  . Cholecystectomy    . Abdominal hysterectomy    . Wisdom tooth extraction      No family history on file.  Social History  Substance Use Topics  . Smoking status: Never Smoker   . Smokeless tobacco: None  . Alcohol Use: No     Review of Systems  Constitutional: Negative for fever and chills.  HENT: Negative for congestion, postnasal drip  and rhinorrhea.   Eyes: Negative for pain and redness.  Respiratory: Negative for shortness of breath.   Cardiovascular: Negative for chest pain.  Gastrointestinal: Positive for nausea and abdominal pain. Negative for vomiting, constipation, blood in stool and abdominal distention.  Genitourinary: Positive for vaginal bleeding. Negative for dysuria, urgency, flank pain and vaginal discharge.  Musculoskeletal: Positive for back pain and neck pain. Negative for myalgias.  Skin: Negative for rash.  Neurological: Negative for dizziness, weakness, light-headedness and headaches.  All other systems reviewed and are negative.  Home Medications   Prior to Admission medications   Medication Sig Start Date End Date Taking? Authorizing Provider  cephALEXin (KEFLEX) 500 MG capsule Take 1 capsule (500 mg total) by mouth 4 (four) times daily. 10/14/14   Joycie Peek, PA-C  ClonazePAM (KLONOPIN PO) Take by mouth.    Historical Provider, MD  DOXEPIN HCL PO Take by mouth.    Historical Provider, MD  HYDROcodone-acetaminophen (NORCO) 5-325 MG per tablet Take 2 tablets by mouth every 4 (four) hours as needed. 10/14/14   Joycie Peek, PA-C  ibuprofen (ADVIL,MOTRIN) 800 MG tablet Take 1 tablet (800 mg total) by mouth 3 (three) times daily. 05/29/13   Teressa Lower, NP  Misc Natural Products (APPLE CIDER VINEGAR) TABS Take by mouth.    Historical Provider, MD  Nebivolol HCl (BYSTOLIC PO) Take by mouth.    Historical Provider, MD  OMEPRAZOLE PO Take by mouth.  Historical Provider, MD  ondansetron (ZOFRAN ODT) 4 MG disintegrating tablet  ODT q4 hours prn nausea/vomit 10/14/14   Joycie Peek, PA-C    Allergies  Erythromycin and Latex  Triage Vitals: BP 142/77 mmHg  Pulse 73  Temp(Src) 98.1 F (36.7 C) (Oral)  Resp 18  Ht  (1.473 m)  Wt 120 lb (54.432 kg)  BMI 25.09 kg/m2  SpO2 100%  Physical Exam  Constitutional: She is oriented to person, place, and time. She appears  well-developed and well-nourished. No distress.  HENT:  Head: Normocephalic and atraumatic.  Right Ear: External ear normal.  Left Ear: External ear normal.  Nose: Nose normal.  Mouth/Throat: Oropharynx is clear and moist. No oropharyngeal exudate.  Eyes: EOM are normal. Pupils are equal, round, and reactive to light.  Neck: Normal range of motion. Neck supple.  Cardiovascular: Normal rate, regular rhythm, normal heart sounds and intact distal pulses.   No murmur heard. Pulmonary/Chest: Effort normal and breath sounds normal. No respiratory distress. She has no wheezes. She has no rales.  Abdominal: Soft. She exhibits no distension. There is no tenderness.  Reports mild tenderness on examination in the left abdomen but able to distract patient.  Musculoskeletal: Normal range of motion. She exhibits no edema or tenderness.  Neurological: She is alert and oriented to person, place, and time.  Skin: Skin is warm and dry. No rash noted. She is not diaphoretic.  Psychiatric: She has a normal mood and affect. Judgment normal.  Nursing note and vitals reviewed.   ED Course  Procedures   DIAGNOSTIC STUDIES: Oxygen Saturation is 100% on RA, normal by my interpretation.    COORDINATION OF CARE: 5:54 PM Discussed treatment plan which includes lab work with pt at bedside and pt agreed to plan.  8:06 PM I re-evaluated the patient and provided an update on the results of her lab work.    Labs Reviewed  URINALYSIS, ROUTINE W REFLEX MICROSCOPIC (NOT AT [facility]) - Abnormal; Notable for the following:    Specific Gravity, Urine 1.002 (*)    Leukocytes, UA SMALL (*)    All other components within normal limits  CBC WITH DIFFERENTIAL/PLATELET - Abnormal; Notable for the following:    Lymphs Abs 4.3 (*)    All other components within normal limits  COMPREHENSIVE METABOLIC PANEL - Abnormal; Notable for the following:    Potassium 3.4 (*)    All other components within normal limits  URINE  MICROSCOPIC-ADD ON  LIPASE, BLOOD    Imaging Review No results found.  I personally reviewed and evaluated these lab results as a part of my medical decision-making.    MDM  Patient seen and evaluated in stable condition.  Benign examination.  Results from recent imaging (abd CT and pelvic US) reviewed in chart and unremarkable laboratory studies unremarkable.  Patient given protonix, pain medication, IV fluids with improvement.  Patient remained well appearing.  Patient was discharged home in stable condition with instruction to follow up with her PCP. Final diagnoses:  Abdominal pain, unspecified abdominal location     I personally performed the services described in this documentation, which was scribed in my presence. The recorded information has been reviewed and is accurate.   Carla Baptist, MD 03/12/15 [phone]

## 2015-06-12 DIAGNOSIS — M542 Cervicalgia: Secondary | ICD-10-CM | POA: Diagnosis not present

## 2015-06-12 DIAGNOSIS — G44229 Chronic tension-type headache, not intractable: Secondary | ICD-10-CM | POA: Diagnosis not present

## 2015-06-12 DIAGNOSIS — K589 Irritable bowel syndrome without diarrhea: Secondary | ICD-10-CM | POA: Diagnosis not present

## 2015-06-12 DIAGNOSIS — F339 Major depressive disorder, recurrent, unspecified: Secondary | ICD-10-CM | POA: Diagnosis not present

## 2015-06-12 DIAGNOSIS — G479 Sleep disorder, unspecified: Secondary | ICD-10-CM | POA: Diagnosis not present

## 2015-08-12 DIAGNOSIS — G8929 Other chronic pain: Secondary | ICD-10-CM | POA: Diagnosis not present

## 2015-08-12 DIAGNOSIS — F419 Anxiety disorder, unspecified: Secondary | ICD-10-CM | POA: Diagnosis not present

## 2015-08-12 DIAGNOSIS — I10 Essential (primary) hypertension: Secondary | ICD-10-CM | POA: Diagnosis not present

## 2015-08-12 DIAGNOSIS — M549 Dorsalgia, unspecified: Secondary | ICD-10-CM | POA: Diagnosis not present

## 2015-08-12 DIAGNOSIS — F339 Major depressive disorder, recurrent, unspecified: Secondary | ICD-10-CM | POA: Diagnosis not present

## 2015-08-12 DIAGNOSIS — K589 Irritable bowel syndrome without diarrhea: Secondary | ICD-10-CM | POA: Diagnosis not present

## 2015-09-30 DIAGNOSIS — F419 Anxiety disorder, unspecified: Secondary | ICD-10-CM | POA: Diagnosis not present

## 2015-09-30 DIAGNOSIS — F339 Major depressive disorder, recurrent, unspecified: Secondary | ICD-10-CM | POA: Diagnosis not present

## 2015-09-30 DIAGNOSIS — I10 Essential (primary) hypertension: Secondary | ICD-10-CM | POA: Diagnosis not present

## 2015-11-18 DIAGNOSIS — G44219 Episodic tension-type headache, not intractable: Secondary | ICD-10-CM | POA: Diagnosis not present

## 2016-04-08 DIAGNOSIS — K589 Irritable bowel syndrome without diarrhea: Secondary | ICD-10-CM | POA: Diagnosis not present

## 2016-04-08 DIAGNOSIS — G44229 Chronic tension-type headache, not intractable: Secondary | ICD-10-CM | POA: Diagnosis not present

## 2016-04-08 DIAGNOSIS — Z0000 Encounter for general adult medical examination without abnormal findings: Secondary | ICD-10-CM | POA: Diagnosis not present

## 2016-04-08 DIAGNOSIS — E7800 Pure hypercholesterolemia, unspecified: Secondary | ICD-10-CM | POA: Diagnosis not present

## 2016-04-08 DIAGNOSIS — K219 Gastro-esophageal reflux disease without esophagitis: Secondary | ICD-10-CM | POA: Diagnosis not present

## 2016-04-08 DIAGNOSIS — F339 Major depressive disorder, recurrent, unspecified: Secondary | ICD-10-CM | POA: Diagnosis not present

## 2016-04-08 DIAGNOSIS — F419 Anxiety disorder, unspecified: Secondary | ICD-10-CM | POA: Diagnosis not present

## 2016-04-08 DIAGNOSIS — I10 Essential (primary) hypertension: Secondary | ICD-10-CM | POA: Diagnosis not present

## 2016-09-01 DIAGNOSIS — Z0100 Encounter for examination of eyes and vision without abnormal findings: Secondary | ICD-10-CM | POA: Diagnosis not present

## 2016-09-01 DIAGNOSIS — G44219 Episodic tension-type headache, not intractable: Secondary | ICD-10-CM | POA: Diagnosis not present

## 2016-09-01 DIAGNOSIS — H40033 Anatomical narrow angle, bilateral: Secondary | ICD-10-CM | POA: Diagnosis not present

## 2016-09-03 DIAGNOSIS — I9589 Other hypotension: Secondary | ICD-10-CM | POA: Diagnosis not present

## 2016-09-03 DIAGNOSIS — G4489 Other headache syndrome: Secondary | ICD-10-CM | POA: Diagnosis not present

## 2016-09-03 DIAGNOSIS — R42 Dizziness and giddiness: Secondary | ICD-10-CM | POA: Diagnosis not present

## 2016-09-03 DIAGNOSIS — R110 Nausea: Secondary | ICD-10-CM | POA: Diagnosis not present

## 2016-10-08 DIAGNOSIS — I10 Essential (primary) hypertension: Secondary | ICD-10-CM | POA: Diagnosis not present

## 2016-10-08 DIAGNOSIS — G44229 Chronic tension-type headache, not intractable: Secondary | ICD-10-CM | POA: Diagnosis not present

## 2016-10-08 DIAGNOSIS — F419 Anxiety disorder, unspecified: Secondary | ICD-10-CM | POA: Diagnosis not present

## 2016-10-08 DIAGNOSIS — F339 Major depressive disorder, recurrent, unspecified: Secondary | ICD-10-CM | POA: Diagnosis not present

## 2016-10-08 DIAGNOSIS — R635 Abnormal weight gain: Secondary | ICD-10-CM | POA: Diagnosis not present

## 2016-11-12 DIAGNOSIS — G629 Polyneuropathy, unspecified: Secondary | ICD-10-CM | POA: Diagnosis not present

## 2016-11-12 DIAGNOSIS — R200 Anesthesia of skin: Secondary | ICD-10-CM | POA: Diagnosis not present

## 2016-11-12 DIAGNOSIS — R531 Weakness: Secondary | ICD-10-CM | POA: Diagnosis not present

## 2016-11-12 DIAGNOSIS — Q8789 Other specified congenital malformation syndromes, not elsewhere classified: Secondary | ICD-10-CM | POA: Diagnosis not present

## 2016-11-12 DIAGNOSIS — R202 Paresthesia of skin: Secondary | ICD-10-CM | POA: Diagnosis not present

## 2016-11-25 DIAGNOSIS — R202 Paresthesia of skin: Secondary | ICD-10-CM | POA: Diagnosis not present

## 2016-11-25 DIAGNOSIS — F419 Anxiety disorder, unspecified: Secondary | ICD-10-CM | POA: Diagnosis not present

## 2016-11-25 DIAGNOSIS — G44229 Chronic tension-type headache, not intractable: Secondary | ICD-10-CM | POA: Diagnosis not present

## 2016-11-25 DIAGNOSIS — I10 Essential (primary) hypertension: Secondary | ICD-10-CM | POA: Diagnosis not present

## 2016-11-25 DIAGNOSIS — F339 Major depressive disorder, recurrent, unspecified: Secondary | ICD-10-CM | POA: Diagnosis not present

## 2016-12-17 DIAGNOSIS — K589 Irritable bowel syndrome without diarrhea: Secondary | ICD-10-CM | POA: Diagnosis not present

## 2016-12-17 DIAGNOSIS — F339 Major depressive disorder, recurrent, unspecified: Secondary | ICD-10-CM | POA: Diagnosis not present

## 2016-12-17 DIAGNOSIS — I10 Essential (primary) hypertension: Secondary | ICD-10-CM | POA: Diagnosis not present

## 2016-12-17 DIAGNOSIS — F419 Anxiety disorder, unspecified: Secondary | ICD-10-CM | POA: Diagnosis not present

## 2016-12-17 DIAGNOSIS — G44229 Chronic tension-type headache, not intractable: Secondary | ICD-10-CM | POA: Diagnosis not present

## 2016-12-17 DIAGNOSIS — K5909 Other constipation: Secondary | ICD-10-CM | POA: Diagnosis not present

## 2017-03-23 ENCOUNTER — Emergency Department (HOSPITAL_BASED_OUTPATIENT_CLINIC_OR_DEPARTMENT_OTHER): Payer: Medicare Other

## 2017-03-23 ENCOUNTER — Encounter (HOSPITAL_BASED_OUTPATIENT_CLINIC_OR_DEPARTMENT_OTHER): Payer: Self-pay | Admitting: Emergency Medicine

## 2017-03-23 ENCOUNTER — Emergency Department (HOSPITAL_BASED_OUTPATIENT_CLINIC_OR_DEPARTMENT_OTHER)
Admission: EM | Admit: 2017-03-23 | Discharge: 2017-03-23 | Disposition: A | Discharge status: Home / Self Care | Payer: Medicare Other | Attending: Emergency Medicine | Admitting: Emergency Medicine

## 2017-03-23 DIAGNOSIS — I10 Essential (primary) hypertension: Secondary | ICD-10-CM | POA: Insufficient documentation

## 2017-03-23 DIAGNOSIS — Z91040 Latex allergy status: Secondary | ICD-10-CM | POA: Insufficient documentation

## 2017-03-23 DIAGNOSIS — N200 Calculus of kidney: Secondary | ICD-10-CM

## 2017-03-23 DIAGNOSIS — Z79899 Other long term (current) drug therapy: Secondary | ICD-10-CM | POA: Insufficient documentation

## 2017-03-23 DIAGNOSIS — N201 Calculus of ureter: Secondary | ICD-10-CM | POA: Diagnosis not present

## 2017-03-23 DIAGNOSIS — N132 Hydronephrosis with renal and ureteral calculous obstruction: Secondary | ICD-10-CM | POA: Diagnosis not present

## 2017-03-23 DIAGNOSIS — R1032 Left lower quadrant pain: Secondary | ICD-10-CM | POA: Diagnosis present

## 2017-03-23 LAB — COMPREHENSIVE METABOLIC PANEL
ALT: 13 U/L — ABNORMAL LOW (ref 14–54)
AST: 16 U/L (ref 15–41)
Albumin: 4.3 g/dL (ref 3.5–5.0)
Alkaline Phosphatase: 75 U/L (ref 38–126)
Anion gap: 8 (ref 5–15)
BUN: 11 mg/dL (ref 6–20)
CHLORIDE: 106 mmol/L (ref 101–111)
CO2: 23 mmol/L (ref 22–32)
Calcium: 9 mg/dL (ref 8.9–10.3)
Creatinine, Ser: 0.98 mg/dL (ref 0.44–1.00)
GFR calc Af Amer: >60 mL/min (ref >60)
GLUCOSE: 107 mg/dL — AB (ref 65–99)
Potassium: 3.3 mmol/L — ABNORMAL LOW (ref 3.5–5.1)
Sodium: 137 mmol/L (ref 135–145)
TOTAL PROTEIN: 7.4 g/dL (ref 6.5–8.1)
Total Bilirubin: 0.1 mg/dL — ABNORMAL LOW (ref 0.3–1.2)

## 2017-03-23 LAB — URINALYSIS, ROUTINE W REFLEX MICROSCOPIC
Bilirubin Urine: NEGATIVE
GLUCOSE, UA: NEGATIVE mg/dL
Ketones, ur: NEGATIVE mg/dL
Leukocytes, UA: NEGATIVE
Nitrite: NEGATIVE
Protein, ur: NEGATIVE mg/dL
Specific Gravity, Urine: 1.025 (ref 1.005–1.030)
pH: 6 (ref 5.0–8.0)

## 2017-03-23 LAB — PREGNANCY, URINE: Preg Test, Ur: NEGATIVE

## 2017-03-23 LAB — URINALYSIS, MICROSCOPIC (REFLEX)

## 2017-03-23 LAB — CBC
HCT: 39.6 % (ref 36.0–46.0)
Hemoglobin: 13.2 g/dL (ref 12.0–15.0)
MCH: 30.1 pg (ref 26.0–34.0)
MCHC: 33.3 g/dL (ref 30.0–36.0)
MCV: 90.4 fL (ref 78.0–100.0)
Platelets: 260 10*3/uL (ref 150–400)
RBC: 4.38 MIL/uL (ref 3.87–5.11)
RDW: 12.3 % (ref 11.5–15.5)
WBC: 7.2 10*3/uL (ref 4.0–10.5)

## 2017-03-23 LAB — LIPASE, BLOOD: Lipase: 41 U/L (ref 11–51)

## 2017-03-23 MED ORDER — ONDANSETRON HCL 4 MG/2ML IJ SOLN
4.0000 mg | Freq: Once | INTRAMUSCULAR | Status: AC | PRN
  Administered 2017-03-23: 4 mg via INTRAVENOUS

## 2017-03-23 MED ORDER — HYDROCODONE-ACETAMINOPHEN 5-325 MG PO TABS: 1.0000 | ORAL_TABLET | Freq: Four times a day (QID) | ORAL | 0 refills | Status: DC | PRN

## 2017-03-23 MED ORDER — ONDANSETRON HCL 4 MG/2ML IJ SOLN
INTRAMUSCULAR | Status: AC
  Administered 2017-03-23: 4 mg via INTRAVENOUS
  Filled 2017-03-23: qty 2

## 2017-03-23 MED ORDER — PROMETHAZINE HCL 25 MG PO TABS
12.5000 mg | ORAL_TABLET | Freq: Once | ORAL | Status: AC
  Administered 2017-03-23: 12.5 mg via ORAL
  Filled 2017-03-23: qty 1

## 2017-03-23 MED ORDER — IOPAMIDOL (ISOVUE-300) INJECTION 61%
100.0000 mL | Freq: Once | INTRAVENOUS | Status: AC | PRN
  Administered 2017-03-23: 100 mL via INTRAVENOUS

## 2017-03-23 MED ORDER — KETOROLAC TROMETHAMINE 30 MG/ML IJ SOLN
30.0000 mg | Freq: Once | INTRAMUSCULAR | Status: AC
  Administered 2017-03-23: 30 mg via INTRAVENOUS
  Filled 2017-03-23: qty 1

## 2017-03-23 MED ORDER — SODIUM CHLORIDE 0.9 % IV BOLUS (SEPSIS)
500.0000 mL | Freq: Once | INTRAVENOUS | Status: AC
Start: 2017-03-23 — End: 2017-03-23
  Administered 2017-03-23: 500 mL via INTRAVENOUS

## 2017-03-23 MED ORDER — PROMETHAZINE HCL 12.5 MG PO TABS: 12.5000 mg | ORAL_TABLET | Freq: Four times a day (QID) | ORAL | 0 refills | Status: AC | PRN

## 2017-03-23 MED ORDER — NAPROXEN 500 MG PO TABS: 500.0000 mg | ORAL_TABLET | Freq: Two times a day (BID) | ORAL | 0 refills | Status: DC | PRN

## 2017-03-23 MED ORDER — POTASSIUM CHLORIDE CRYS ER 20 MEQ PO TBCR
40.0000 meq | EXTENDED_RELEASE_TABLET | Freq: Once | ORAL | Status: AC
  Administered 2017-03-23: 40 meq via ORAL
  Filled 2017-03-23: qty 2

## 2017-03-23 MED ORDER — MORPHINE SULFATE (PF) 4 MG/ML IV SOLN
4.0000 mg | Freq: Once | INTRAVENOUS | Status: AC
  Administered 2017-03-23: 4 mg via INTRAVENOUS
  Filled 2017-03-23: qty 1

## 2017-03-23 MED FILL — HYDROCODON-APAP 5-325: 5-325 | 2 days supply | Qty: 10 | Fill #0

## 2017-03-23 MED FILL — NAPROXEN 500 MG TABLET: 500 | 15 days supply | Qty: 30 | Fill #0

## 2017-03-23 MED FILL — PROMETHAZINE 12.5 MG TABLET: 12.5 | 2 days supply | Qty: 10 | Fill #0

## 2017-03-23 NOTE — Discharge Instructions (Signed)
It was my pleasure taking care of you today!   You have been diagnosed with kidney stones. Drink plenty of fluids to help you pass the stone.  Take naproxen as directed with food for mild to moderate pain. Use your pain medication as directed and only as needed for severe pain. Use Phenergan for nausea as directed.  Follow up with your primary care doctor in regards to your hospital visit.   Return to the ED immediately if you develop fever that persists > 101, uncontrolled pain or vomiting, or other concerns.   Do not drink alcohol, drive or participate in any other potentially dangerous activities while taking opiate pain medication as it may make you sleepy. Do not take this medication with any other sedating medications, either prescription or over-the-counter. If you were prescribed Percocet or Vicodin, do not take these with acetaminophen (Tylenol) as it is already contained within these medications.   This medication is an opiate (or narcotic) pain medication and can be habit forming.  Use it as little as possible to achieve adequate pain control.  Do not use or use it with extreme caution if you have a history of opiate abuse or dependence. This medication is intended for your use only - do not give any to anyone else and keep it in a secure place where nobody else, especially children, have access to it. It will also cause or worsen constipation, so you may want to consider taking an over-the-counter stool softener while you are taking this medication.

## 2017-03-23 NOTE — ED Provider Notes (Signed)
[facility] EMERGENCY DEPARTMENT Provider Note   CSN: [id number] Arrival date & time: 03/23/17  4098     History   Chief Complaint Chief Complaint  Patient presents with  . Abdominal Pain    HPI Carla Jackson is a 36 y.o. female.  The history is provided by the patient and medical records. No language interpreter was used.  Abdominal Pain   Associated symptoms include diarrhea, nausea, vomiting and constipation.   Carla Jackson is a 36 y.o. female  with a PMH of GERD, IBS who presents to the Emergency Department complaining of progressively worsening LLQ pain x 2-3 days. Patient notes generalized abdominal discomfort over the last 2-3 weeks, initially feeling constipated, then had several days of loose non-bloody stools, and now feels constipated again. This is slightly more severe then the changes she typically experiences with her IBS. 2-3 days ago pain became plan just in the left lower quadrant. Initially it was intermittent, but this morning pain has been sharp and constant. She became nauseous and had one episode of emesis, prompting her to seek emergency care. She denies any history of similar pain. She took Phenergan at home for nausea with adequate relief, but has not taken any this morning. Denies fever or chills. Denies sick contacts. No vaginal discharge, dysuria, back pain, chest pain or trouble breathing.   Past Medical History:  Diagnosis Date  . Anxiety   . Depression   . GERD (gastroesophageal reflux disease)   . Hypertension   . IBS (irritable bowel syndrome)     There are no active problems to display for this patient.   Past Surgical History:  Procedure Laterality Date  . ABDOMINAL HYSTERECTOMY    . CHOLECYSTECTOMY    . WISDOM TOOTH EXTRACTION      OB History    No data available       Home Medications    Prior to Admission medications   Medication Sig Start Date End Date Taking? Authorizing Provider  ClonazePAM (KLONOPIN PO)  Take by mouth.    [provider]  HYDROcodone-acetaminophen (NORCO/VICODIN) 5-325 MG tablet Take 1 tablet by mouth every 6 (six) hours as needed for severe pain. 03/23/17   Ward, Chase Picket, PA-C  Misc Natural Products (APPLE CIDER VINEGAR) TABS Take by mouth.    [provider]  naproxen (NAPROSYN) 500 MG tablet Take 1 tablet (500 mg total) by mouth 2 (two) times daily as needed for mild pain or moderate pain. 03/23/17   Ward, Chase Picket, PA-C  OMEPRAZOLE PO Take by mouth.    [provider]  promethazine (PHENERGAN) 12.5 MG tablet Take 1 tablet (12.5 mg total) by mouth every 6 (six) hours as needed for nausea or vomiting. 03/23/17   Ward, Chase Picket, PA-C    Family History No family history on file.  Social History Social History  Substance Use Topics  . Smoking status: Never Smoker  . Smokeless tobacco: Never Used  . Alcohol use No     Allergies   Erythromycin and Latex   Review of Systems Review of Systems  Gastrointestinal: Positive for abdominal pain, constipation, diarrhea, nausea and vomiting. Negative for blood in stool.  All other systems reviewed and are negative.    Physical Exam Updated Vital Signs BP 129/70 (BP Location: Left Arm) Comment: Simultaneous filing. User may not have seen previous data.  Pulse 73   Temp 97.6 F (36.4 C) (Oral)   Resp 18   Ht 4\' 10"  (1.473 m)  Wt 59.4 kg (131 lb)   SpO2 100%   BMI 27.38 kg/m   Physical Exam  Constitutional: She is oriented to person, place, and time. She appears well-developed and well-nourished. No distress.  HENT:  Head: Normocephalic and atraumatic.  Cardiovascular: Normal rate, regular rhythm and normal heart sounds.   No murmur heard. Pulmonary/Chest: Effort normal and breath sounds normal. No respiratory distress.  Abdominal: Soft. Bowel sounds are normal. She exhibits no distension. There is tenderness (LLQ and left flank).  Musculoskeletal: She exhibits no edema.   Neurological: She is alert and oriented to person, place, and time.  Skin: Skin is warm and dry.  Nursing note and vitals reviewed.    ED Treatments / Results  Labs (all labs ordered are listed, but only abnormal results are displayed) Labs Reviewed  URINALYSIS, ROUTINE W REFLEX MICROSCOPIC - Abnormal; Notable for the following:       Result Value   APPearance CLOUDY (*)    Hgb urine dipstick LARGE (*)    All other components within normal limits  COMPREHENSIVE METABOLIC PANEL - Abnormal; Notable for the following:    Potassium 3.3 (*)    Glucose, Bld 107 (*)    ALT 13 (*)    Total Bilirubin 0.1 (*)    All other components within normal limits  URINALYSIS, MICROSCOPIC (REFLEX) - Abnormal; Notable for the following:    Bacteria, UA MANY (*)    Squamous Epithelial / LPF 6-30 (*)    All other components within normal limits  PREGNANCY, URINE  LIPASE, BLOOD  CBC    EKG  EKG Interpretation None       Radiology Ct Abdomen Pelvis W Contrast  Result Date: 03/23/2017 CLINICAL DATA:  Left lower quadrant pain for 2 weeks, vomiting EXAM: CT ABDOMEN AND PELVIS WITH CONTRAST TECHNIQUE: Multidetector CT imaging of the abdomen and pelvis was performed using the standard protocol following bolus administration of intravenous contrast. CONTRAST:  100mL ISOVUE-300 IOPAMIDOL (ISOVUE-300) INJECTION 61% COMPARISON:  02/28/2015 FINDINGS: Lower chest: Lung bases are clear. No effusions. Heart is normal size. Hepatobiliary: No focal liver abnormality is seen. Status post cholecystectomy. No biliary dilatation. Pancreas: No focal abnormality or ductal dilatation. Spleen: No focal abnormality.  Normal size. Adrenals/Urinary Tract: Mild left hydronephrosis due to 2 mm stone in the mid left ureter just above the pelvic brim. Delayed excretion of contrast from the left kidney. No renal stones. No hydronephrosis on the right. Adrenal glands and urinary bladder are unremarkable. Stomach/Bowel: Stomach,  large and small bowel grossly unremarkable. Vascular/Lymphatic: No evidence of aneurysm or adenopathy. Reproductive: Prior hysterectomy.  No adnexal masses. Other: No free fluid or free air. Musculoskeletal: No acute bony abnormality. IMPRESSION: 2 mm mid left ureteral stone with mild left hydronephrosis and delayed excretion of contrast from the left kidney. Electronically Signed   By: Charlett NoseKevin  Dover M.D.   On: 03/23/2017 10:45    Procedures Procedures (including critical care time)  Medications Ordered in ED Medications  ondansetron (ZOFRAN) injection 4 mg (4 mg Intravenous Given 03/23/17 0925)  morphine 4 MG/ML injection 4 mg (4 mg Intravenous Given 03/23/17 0932)  iopamidol (ISOVUE-300) 61 % injection 100 mL (100 mLs Intravenous Contrast Given 03/23/17 1025)  sodium chloride 0.9 % bolus 500 mL (500 mLs Intravenous New Bag/Given 03/23/17 1136)  ketorolac (TORADOL) 30 MG/ML injection 30 mg (30 mg Intravenous Given 03/23/17 1138)  potassium chloride SA (K-DUR,KLOR-CON) CR tablet 40 mEq (40 mEq Oral Given 03/23/17 1204)  promethazine (PHENERGAN) tablet 12.5 mg (  12.5 mg Oral Given 03/23/17 1145)     Initial Impression / Assessment and Plan / ED Course  I have reviewed the triage vital signs and the nursing notes.  Pertinent labs & imaging results that were available during my care of the patient were reviewed by me and considered in my medical decision making (see chart for details).    Carla Jackson is a 36 y.o. female who presents to ED for LLQ abdominal pain. UA without signs of infection. Labs reassuring: K+ of 3.3 which was replenished in ED. CT shows 2 mm left ureteral stone with mild left hydro. Symptoms managed in ED. Patient tolerating PO and patient feels comfortable with discharge to home. Reasons to return to ER and home care plan discussed. All questions answered.    Final Clinical Impressions(s) / ED Diagnoses   Final diagnoses:  Kidney stone    New Prescriptions New  Prescriptions   HYDROCODONE-ACETAMINOPHEN (NORCO/VICODIN) 5-325 MG TABLET    Take 1 tablet by mouth every 6 (six) hours as needed for severe pain.   NAPROXEN (NAPROSYN) 500 MG TABLET    Take 1 tablet (500 mg total) by mouth 2 (two) times daily as needed for mild pain or moderate pain.   PROMETHAZINE (PHENERGAN) 12.5 MG TABLET    Take 1 tablet (12.5 mg total) by mouth every 6 (six) hours as needed for nausea or vomiting.     Ward, Chase Picket, PA-C 03/23/17 1213    Tilden Fossa, MD 03/23/17 [phone]

## 2017-03-23 NOTE — ED Notes (Signed)
Up to BR.

## 2017-03-23 NOTE — ED Notes (Signed)
Vomiting green liquid

## 2017-06-17 DIAGNOSIS — F419 Anxiety disorder, unspecified: Secondary | ICD-10-CM | POA: Diagnosis not present

## 2017-06-17 DIAGNOSIS — K219 Gastro-esophageal reflux disease without esophagitis: Secondary | ICD-10-CM | POA: Diagnosis not present

## 2017-06-17 DIAGNOSIS — K589 Irritable bowel syndrome without diarrhea: Secondary | ICD-10-CM | POA: Diagnosis not present

## 2017-06-17 DIAGNOSIS — G479 Sleep disorder, unspecified: Secondary | ICD-10-CM | POA: Diagnosis not present

## 2017-06-17 DIAGNOSIS — I10 Essential (primary) hypertension: Secondary | ICD-10-CM | POA: Diagnosis not present

## 2017-06-17 DIAGNOSIS — Z0000 Encounter for general adult medical examination without abnormal findings: Secondary | ICD-10-CM | POA: Diagnosis not present

## 2017-06-17 DIAGNOSIS — R110 Nausea: Secondary | ICD-10-CM | POA: Diagnosis not present

## 2017-06-17 DIAGNOSIS — E782 Mixed hyperlipidemia: Secondary | ICD-10-CM | POA: Diagnosis not present

## 2017-06-17 DIAGNOSIS — G44229 Chronic tension-type headache, not intractable: Secondary | ICD-10-CM | POA: Diagnosis not present

## 2017-06-17 DIAGNOSIS — F339 Major depressive disorder, recurrent, unspecified: Secondary | ICD-10-CM | POA: Diagnosis not present

## 2017-08-16 DIAGNOSIS — J22 Unspecified acute lower respiratory infection: Secondary | ICD-10-CM | POA: Diagnosis not present

## 2017-08-16 DIAGNOSIS — F339 Major depressive disorder, recurrent, unspecified: Secondary | ICD-10-CM | POA: Diagnosis not present

## 2017-08-16 DIAGNOSIS — G44229 Chronic tension-type headache, not intractable: Secondary | ICD-10-CM | POA: Diagnosis not present

## 2017-12-09 ENCOUNTER — Emergency Department (HOSPITAL_BASED_OUTPATIENT_CLINIC_OR_DEPARTMENT_OTHER): Payer: Medicare Other

## 2017-12-09 ENCOUNTER — Encounter (HOSPITAL_BASED_OUTPATIENT_CLINIC_OR_DEPARTMENT_OTHER): Payer: Self-pay | Admitting: Emergency Medicine

## 2017-12-09 ENCOUNTER — Other Ambulatory Visit: Payer: Self-pay

## 2017-12-09 ENCOUNTER — Emergency Department (HOSPITAL_BASED_OUTPATIENT_CLINIC_OR_DEPARTMENT_OTHER)
Admission: EM | Admit: 2017-12-09 | Discharge: 2017-12-10 | Disposition: A | Discharge status: Home / Self Care | Payer: Medicare Other | Attending: Emergency Medicine | Admitting: Emergency Medicine

## 2017-12-09 DIAGNOSIS — F329 Major depressive disorder, single episode, unspecified: Secondary | ICD-10-CM | POA: Insufficient documentation

## 2017-12-09 DIAGNOSIS — Z0489 Encounter for examination and observation for other specified reasons: Secondary | ICD-10-CM | POA: Diagnosis not present

## 2017-12-09 DIAGNOSIS — Z79899 Other long term (current) drug therapy: Secondary | ICD-10-CM | POA: Diagnosis not present

## 2017-12-09 DIAGNOSIS — N939 Abnormal uterine and vaginal bleeding, unspecified: Secondary | ICD-10-CM | POA: Insufficient documentation

## 2017-12-09 DIAGNOSIS — F419 Anxiety disorder, unspecified: Secondary | ICD-10-CM | POA: Diagnosis not present

## 2017-12-09 DIAGNOSIS — Z91040 Latex allergy status: Secondary | ICD-10-CM | POA: Diagnosis not present

## 2017-12-09 DIAGNOSIS — N200 Calculus of kidney: Secondary | ICD-10-CM | POA: Diagnosis not present

## 2017-12-09 DIAGNOSIS — Z9049 Acquired absence of other specified parts of digestive tract: Secondary | ICD-10-CM | POA: Insufficient documentation

## 2017-12-09 DIAGNOSIS — R1030 Lower abdominal pain, unspecified: Secondary | ICD-10-CM | POA: Diagnosis not present

## 2017-12-09 DIAGNOSIS — I10 Essential (primary) hypertension: Secondary | ICD-10-CM | POA: Insufficient documentation

## 2017-12-09 DIAGNOSIS — Z008 Encounter for other general examination: Secondary | ICD-10-CM

## 2017-12-09 LAB — URINALYSIS, ROUTINE W REFLEX MICROSCOPIC
Bilirubin Urine: NEGATIVE
Glucose, UA: NEGATIVE mg/dL
Ketones, ur: NEGATIVE mg/dL
NITRITE: NEGATIVE
PH: 8 (ref 5.0–8.0)
Protein, ur: NEGATIVE mg/dL
Specific Gravity, Urine: 1.01 (ref 1.005–1.030)

## 2017-12-09 LAB — OCCULT BLOOD X 1 CARD TO LAB, STOOL: Fecal Occult Bld: NEGATIVE

## 2017-12-09 LAB — URINALYSIS, MICROSCOPIC (REFLEX)

## 2017-12-09 NOTE — ED Notes (Addendum)
Pt went to bathroom while waiting on provider, had a hard BM and noted blood when she wiped. Pt now unsure if her original bleeding was from vagina or rectum. Pt sts she has had a total hysterectomy in 2013.

## 2017-12-09 NOTE — ED Triage Notes (Signed)
Pt has hx of a full or partial hysterectomy and tonight she went to the bathroom and is passing dark blood with clots  Pt is c/o lower abd pain   Sxs started around 1700  Pt is crying in triage

## 2017-12-10 ENCOUNTER — Encounter (HOSPITAL_BASED_OUTPATIENT_CLINIC_OR_DEPARTMENT_OTHER): Payer: Self-pay | Admitting: Emergency Medicine

## 2017-12-10 DIAGNOSIS — N200 Calculus of kidney: Secondary | ICD-10-CM | POA: Diagnosis not present

## 2017-12-10 NOTE — ED Provider Notes (Signed)
[facility] EMERGENCY DEPARTMENT Provider Note   CSN: [id number] Arrival date & time: 12/09/17  2117     History   Chief Complaint Chief Complaint  Patient presents with  . Vaginal Bleeding    HPI Carla Jackson is a 37 y.o. female.   Vaginal Bleeding  Primary symptoms include vaginal bleeding. There has been no fever. This is a new problem. The current episode started 3 to 5 hours ago. The problem occurs rarely. The problem has been resolved. The symptoms occur spontaneously. She is not pregnant. She has not missed her period. Her LMP was months ago. She has tried nothing for the symptoms. The treatment provided significant relief. Sexual activity: non-contributory. Associated medical issues do not include STD.  Thought she had vaginal bleeding but had hysterectomy.  Then thought it may have been rectal bleeding because she also had a BM.  No f/c/r. No n/v/d.  No easy bruising or bleeding.    Past Medical History:  Diagnosis Date  . Anxiety   . Depression   . GERD (gastroesophageal reflux disease)   . Hypertension   . IBS (irritable bowel syndrome)     There are no active problems to display for this patient.   Past Surgical History:  Procedure Laterality Date  . ABDOMINAL HYSTERECTOMY    . CHOLECYSTECTOMY    . WISDOM TOOTH EXTRACTION       OB History   None      Home Medications    Prior to Admission medications   Medication Sig Start Date End Date Taking? Authorizing Provider  AMITIZA 8 MCG capsule TAKE ONE CAPSULE BY MOUTH ONCE TO TWICE A DAY AS NEEDED. TAKE WITH FOOD 11/09/17   [provider]  BYSTOLIC 2.5 MG tablet  09/13/17   [provider]  ClonazePAM (KLONOPIN PO) Take by mouth.    [provider]  gabapentin (NEURONTIN) 100 MG capsule  11/08/17   [provider]  HYDROcodone-acetaminophen (NORCO/VICODIN) 5-325 MG tablet Take 1 tablet by mouth every 6 (six) hours as needed for severe pain. 03/23/17    Ward, Chase Picket, PA-C  Misc Natural Products (APPLE CIDER VINEGAR) TABS Take by mouth.    [provider]  naproxen (NAPROSYN) 500 MG tablet Take 1 tablet (500 mg total) by mouth 2 (two) times daily as needed for mild pain or moderate pain. 03/23/17   Ward, Chase Picket, PA-C  OMEPRAZOLE PO Take by mouth.    [provider]  promethazine (PHENERGAN) 12.5 MG tablet Take 1 tablet (12.5 mg total) by mouth every 6 (six) hours as needed for nausea or vomiting. 03/23/17   Ward, Chase Picket, PA-C  topiramate (TOPAMAX) 100 MG tablet  12/06/17   [provider]  venlafaxine XR (EFFEXOR-XR) 75 MG 24 hr capsule TAKE 1 CAPSULE BY MOUTH TWICE A DAY WITH FOOD 11/09/17   [provider]    Family History Family History  Problem Relation Age of Onset  . Cancer Other   . Diabetes Other   . Hypertension Other     Social History Social History   Tobacco Use  . Smoking status: Never Smoker  . Smokeless tobacco: Never Used  Substance Use Topics  . Alcohol use: No  . Drug use: No     Allergies   Erythromycin and Latex   Review of Systems Review of Systems  Constitutional: Negative for fever.  Cardiovascular: Negative for chest pain.  Gastrointestinal: Negative for anal bleeding.  Genitourinary: Positive for vaginal bleeding.  Negative for flank pain and hematuria.  All other systems reviewed and are negative.    Physical Exam Updated Vital Signs BP 135/69   Pulse 76   Temp 98 F (36.7 C)   Resp 19   Ht 4\' 10"  (1.473 m)   Wt 56.7 kg (125 lb)   SpO2 100%   BMI 26.13 kg/m   Physical Exam  Constitutional: She is oriented to person, place, and time. She appears well-developed and well-nourished.  HENT:  Head: Normocephalic and atraumatic.  Nose: Nose normal.  Mouth/Throat: No oropharyngeal exudate.  Eyes: Pupils are equal, round, and reactive to light. Conjunctivae are normal.  Neck: Normal range of motion. Neck supple.  Cardiovascular:  Normal rate, regular rhythm, normal heart sounds and intact distal pulses.  Pulmonary/Chest: Effort normal and breath sounds normal.  Abdominal: Soft. Bowel sounds are normal. She exhibits no mass. There is no tenderness. There is no rebound and no guarding.  Genitourinary: Vagina normal. Rectal exam shows guaiac negative stool. No vaginal discharge found.  Genitourinary Comments: Chaperone present NO vaginal bleeding cuff is intact   Musculoskeletal: Normal range of motion.  Neurological: She is alert and oriented to person, place, and time.  Skin: Skin is warm and dry. Capillary refill takes less than 2 seconds.  Psychiatric: She has a normal mood and affect.     ED Treatments / Results  Labs (all labs ordered are listed, but only abnormal results are displayed) Labs Reviewed  URINALYSIS, ROUTINE W REFLEX MICROSCOPIC - Abnormal; Notable for the following components:      Result Value   APPearance CLOUDY (*)    Hgb urine dipstick MODERATE (*)    Leukocytes, UA TRACE (*)    All other components within normal limits  URINALYSIS, MICROSCOPIC (REFLEX) - Abnormal; Notable for the following components:   Bacteria, UA FEW (*)    All other components within normal limits  OCCULT BLOOD X 1 CARD TO LAB, STOOL    EKG None  Radiology Ct Renal Stone Study  Result Date: 12/10/2017 CLINICAL DATA:  Mid abdominal pain.  Passing clots from vagina. EXAM: CT ABDOMEN AND PELVIS WITHOUT CONTRAST TECHNIQUE: Multidetector CT imaging of the abdomen and pelvis was performed following the standard protocol without IV contrast. COMPARISON:  03/23/2017 FINDINGS: Lower chest:  No contributory findings. Hepatobiliary: No focal liver abnormality.No evidence of biliary obstruction or stone. Pancreas: Unremarkable. Spleen: Unremarkable. Adrenals/Urinary Tract: Negative adrenals. No hydronephrosis or ureteral stone. 2 right renal calculi measuring up to 3 mm at the upper pole unremarkable bladder. Stomach/Bowel: No  obstruction. No appendicitis. Formed stool throughout the colon. Vascular/Lymphatic: No acute vascular abnormality. No mass or adenopathy. Reproductive:Hysterectomy. No explanation for vaginal bleeding. Normal appearance of the bilateral ovaries. Other: Trace peritoneal fluid in the pelvis, usually physiologic. Musculoskeletal: No acute abnormalities. IMPRESSION: 1. No acute finding.  No explanation for vaginal bleeding. 2. Right nephrolithiasis. Electronically Signed   By: Marnee SpringJonathon  Watts M.D.   On: 12/10/2017 00:52    Procedures Procedures (including critical care time)   Final Clinical Impressions(s) / ED Diagnoses   Final diagnoses:  Encounter for rectal exam   I cannot explain what happened this evening.  There is no vaginal bleeding. No hematuria and no rectal bleeding with no external hemorrhoids.  No seen no external source of bleeding.    Return for pain, numbness, changes in vision or speech, fevers >100.4 unrelieved by medication, shortness of breath, intractable vomiting, or diarrhea, abdominal pain, Inability to tolerate liquids or food,  cough, altered mental status or any concerns. No signs of systemic illness or infection. The patient is nontoxic-appearing on exam and vital signs are within normal limits. Will refer to urology for microscopy hematuria as patient is asymptomatic.  I have reviewed the triage vital signs and the nursing notes. Pertinent labs &imaging results that were available during my care of the patient were reviewed by me and considered in my medical decision making (see chart for details).  After history, exam, and medical workup I feel the patient has been appropriately medically screened and is safe for discharge home. Pertinent diagnoses were discussed with the patient. Patient was given return precautions. ED Discharge Orders    None       Blakelynn Scheeler, MD 12/10/17 0730

## 2018-02-24 DIAGNOSIS — I10 Essential (primary) hypertension: Secondary | ICD-10-CM | POA: Diagnosis not present

## 2018-02-24 DIAGNOSIS — F339 Major depressive disorder, recurrent, unspecified: Secondary | ICD-10-CM | POA: Diagnosis not present

## 2018-02-24 DIAGNOSIS — E782 Mixed hyperlipidemia: Secondary | ICD-10-CM | POA: Diagnosis not present

## 2018-02-24 DIAGNOSIS — R87612 Low grade squamous intraepithelial lesion on cytologic smear of cervix (LGSIL): Secondary | ICD-10-CM | POA: Diagnosis not present

## 2018-02-24 DIAGNOSIS — Z124 Encounter for screening for malignant neoplasm of cervix: Secondary | ICD-10-CM | POA: Diagnosis not present

## 2018-02-24 DIAGNOSIS — F419 Anxiety disorder, unspecified: Secondary | ICD-10-CM | POA: Diagnosis not present

## 2018-03-31 DIAGNOSIS — R87622 Low grade squamous intraepithelial lesion on cytologic smear of vagina (LGSIL): Secondary | ICD-10-CM | POA: Diagnosis not present

## 2018-03-31 DIAGNOSIS — Z01419 Encounter for gynecological examination (general) (routine) without abnormal findings: Secondary | ICD-10-CM | POA: Diagnosis not present

## 2018-03-31 DIAGNOSIS — Z90710 Acquired absence of both cervix and uterus: Secondary | ICD-10-CM | POA: Diagnosis not present

## 2018-08-25 DIAGNOSIS — I10 Essential (primary) hypertension: Secondary | ICD-10-CM | POA: Diagnosis not present

## 2018-08-25 DIAGNOSIS — G8929 Other chronic pain: Secondary | ICD-10-CM | POA: Diagnosis not present

## 2018-08-25 DIAGNOSIS — K219 Gastro-esophageal reflux disease without esophagitis: Secondary | ICD-10-CM | POA: Diagnosis not present

## 2018-08-25 DIAGNOSIS — E782 Mixed hyperlipidemia: Secondary | ICD-10-CM | POA: Diagnosis not present

## 2018-08-25 DIAGNOSIS — F419 Anxiety disorder, unspecified: Secondary | ICD-10-CM | POA: Diagnosis not present

## 2018-08-25 DIAGNOSIS — G479 Sleep disorder, unspecified: Secondary | ICD-10-CM | POA: Diagnosis not present

## 2018-08-25 DIAGNOSIS — K589 Irritable bowel syndrome without diarrhea: Secondary | ICD-10-CM | POA: Diagnosis not present

## 2018-08-25 DIAGNOSIS — M549 Dorsalgia, unspecified: Secondary | ICD-10-CM | POA: Diagnosis not present

## 2019-02-01 DIAGNOSIS — M546 Pain in thoracic spine: Secondary | ICD-10-CM | POA: Diagnosis not present

## 2019-02-01 DIAGNOSIS — R3915 Urgency of urination: Secondary | ICD-10-CM | POA: Diagnosis not present

## 2019-02-01 DIAGNOSIS — M545 Low back pain: Secondary | ICD-10-CM | POA: Diagnosis not present

## 2019-02-24 DIAGNOSIS — M549 Dorsalgia, unspecified: Secondary | ICD-10-CM | POA: Diagnosis not present

## 2019-02-24 DIAGNOSIS — I10 Essential (primary) hypertension: Secondary | ICD-10-CM | POA: Diagnosis not present

## 2019-02-24 DIAGNOSIS — F419 Anxiety disorder, unspecified: Secondary | ICD-10-CM | POA: Diagnosis not present

## 2019-02-24 DIAGNOSIS — G44229 Chronic tension-type headache, not intractable: Secondary | ICD-10-CM | POA: Diagnosis not present

## 2019-02-24 DIAGNOSIS — K219 Gastro-esophageal reflux disease without esophagitis: Secondary | ICD-10-CM | POA: Diagnosis not present

## 2019-02-24 DIAGNOSIS — K589 Irritable bowel syndrome without diarrhea: Secondary | ICD-10-CM | POA: Diagnosis not present

## 2019-02-24 DIAGNOSIS — E782 Mixed hyperlipidemia: Secondary | ICD-10-CM | POA: Diagnosis not present

## 2019-02-24 DIAGNOSIS — G629 Polyneuropathy, unspecified: Secondary | ICD-10-CM | POA: Diagnosis not present

## 2019-02-24 DIAGNOSIS — G479 Sleep disorder, unspecified: Secondary | ICD-10-CM | POA: Diagnosis not present

## 2019-02-24 DIAGNOSIS — F339 Major depressive disorder, recurrent, unspecified: Secondary | ICD-10-CM | POA: Diagnosis not present

## 2019-05-04 DIAGNOSIS — Z90710 Acquired absence of both cervix and uterus: Secondary | ICD-10-CM | POA: Diagnosis not present

## 2019-05-04 DIAGNOSIS — R87622 Low grade squamous intraepithelial lesion on cytologic smear of vagina (LGSIL): Secondary | ICD-10-CM | POA: Diagnosis not present

## 2019-05-04 DIAGNOSIS — Z9189 Other specified personal risk factors, not elsewhere classified: Secondary | ICD-10-CM | POA: Diagnosis not present

## 2019-05-04 DIAGNOSIS — R87610 Atypical squamous cells of undetermined significance on cytologic smear of cervix (ASC-US): Secondary | ICD-10-CM | POA: Diagnosis not present

## 2019-05-04 DIAGNOSIS — Z01419 Encounter for gynecological examination (general) (routine) without abnormal findings: Secondary | ICD-10-CM | POA: Diagnosis not present

## 2019-10-10 ENCOUNTER — Other Ambulatory Visit (HOSPITAL_BASED_OUTPATIENT_CLINIC_OR_DEPARTMENT_OTHER): Payer: Self-pay | Admitting: *Deleted

## 2019-10-10 ENCOUNTER — Other Ambulatory Visit (HOSPITAL_BASED_OUTPATIENT_CLINIC_OR_DEPARTMENT_OTHER): Payer: Self-pay | Admitting: Anesthesiology

## 2019-10-10 ENCOUNTER — Other Ambulatory Visit (HOSPITAL_BASED_OUTPATIENT_CLINIC_OR_DEPARTMENT_OTHER): Payer: Self-pay | Admitting: Physician Assistant

## 2019-10-10 DIAGNOSIS — Z1231 Encounter for screening mammogram for malignant neoplasm of breast: Secondary | ICD-10-CM

## 2019-12-05 ENCOUNTER — Other Ambulatory Visit: Payer: Self-pay

## 2019-12-05 ENCOUNTER — Encounter (HOSPITAL_BASED_OUTPATIENT_CLINIC_OR_DEPARTMENT_OTHER): Payer: Self-pay

## 2019-12-05 ENCOUNTER — Ambulatory Visit (HOSPITAL_BASED_OUTPATIENT_CLINIC_OR_DEPARTMENT_OTHER)
Admission: RE | Admit: 2019-12-05 | Discharge: 2019-12-05 | Disposition: A | Discharge status: Home / Self Care | Payer: Medicare Other | Source: Ambulatory Visit | Attending: Physician Assistant | Admitting: Physician Assistant

## 2019-12-05 DIAGNOSIS — Z1231 Encounter for screening mammogram for malignant neoplasm of breast: Secondary | ICD-10-CM | POA: Diagnosis present

## 2020-06-02 IMAGING — CT CT RENAL STONE PROTOCOL
2 of 4 series · 17 of 46 positions shown, 19 images · non-contrast
Comparison: 03/23/2017

CLINICAL DATA: Mid abdominal pain.  Passing clots from vagina.

EXAM:
CT ABDOMEN AND PELVIS WITHOUT CONTRAST
TECHNIQUE: Multidetector CT imaging of the abdomen and pelvis was performed
following the standard protocol without IV contrast.

[Series 2: axial st · axial · 0.70mm/px · z∈[-421,+14]mm · 14 of 95 slices shown, 16 images]
[im 4/95  soft-tissue]
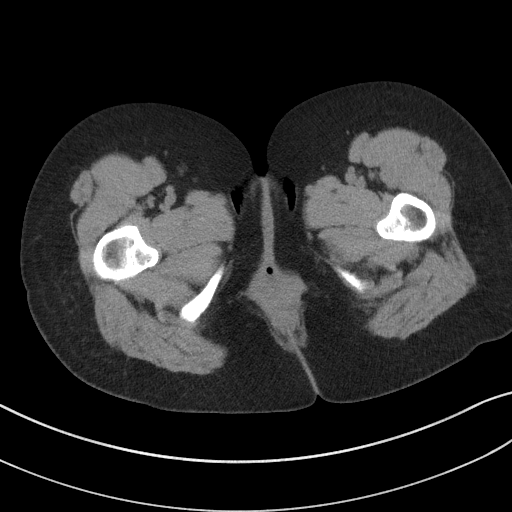
[im 4/95  bone]
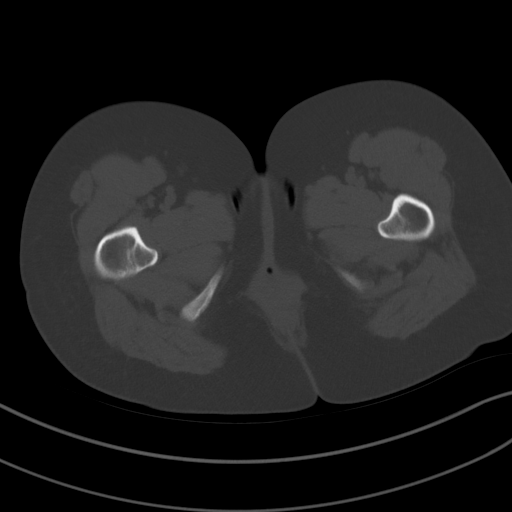
[im 12/95  soft-tissue]
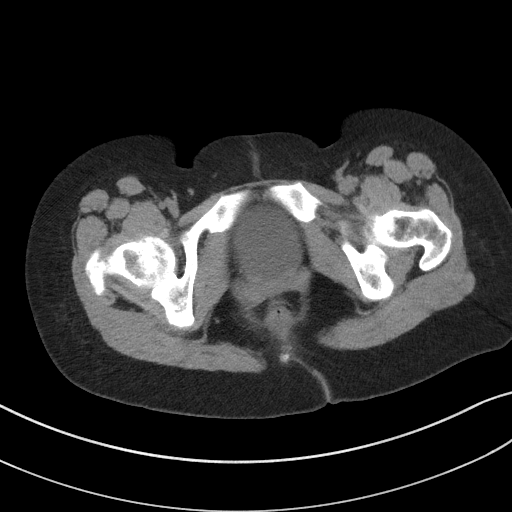
[im 20/95  soft-tissue]
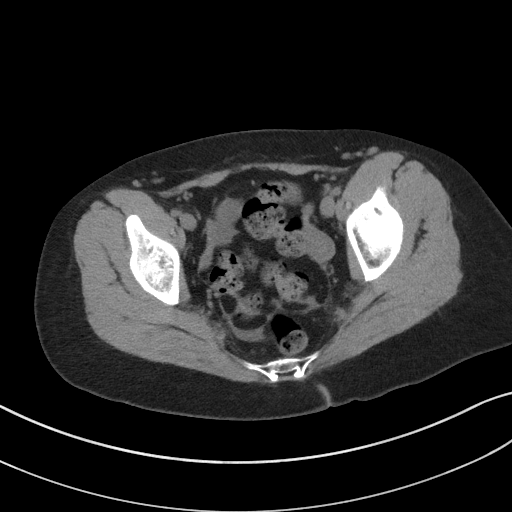
[im 24/95  soft-tissue]
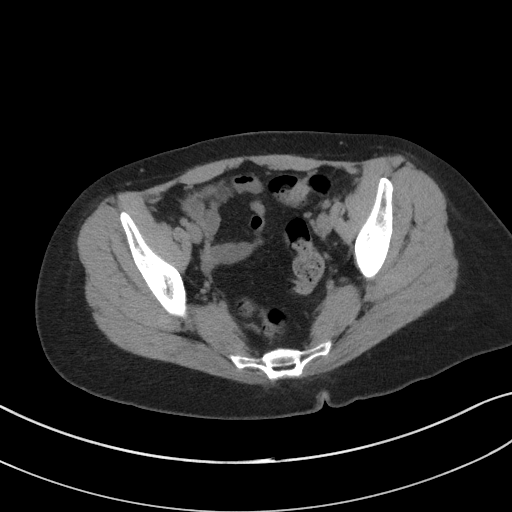
[im 32/95  soft-tissue]
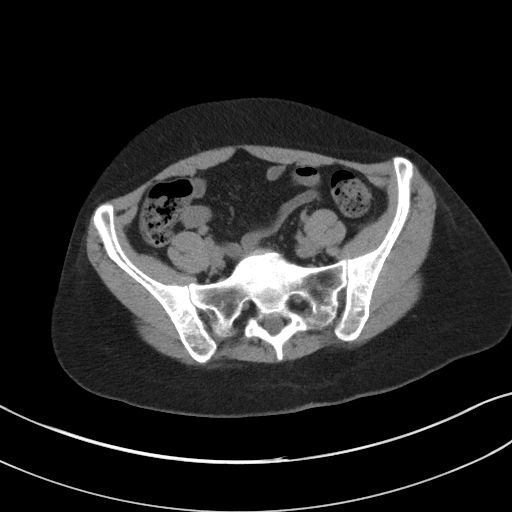
[im 40/95  soft-tissue]
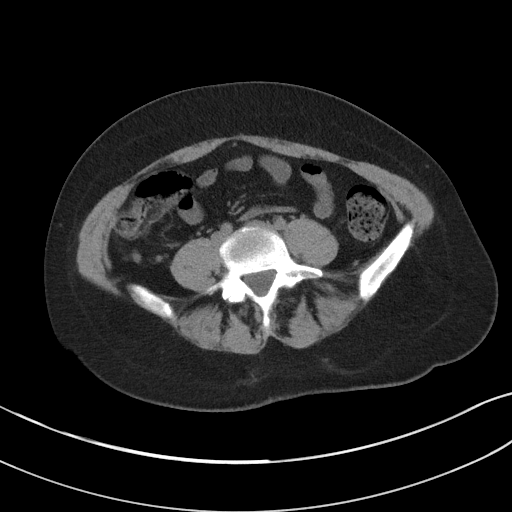
[im 44/95  soft-tissue]
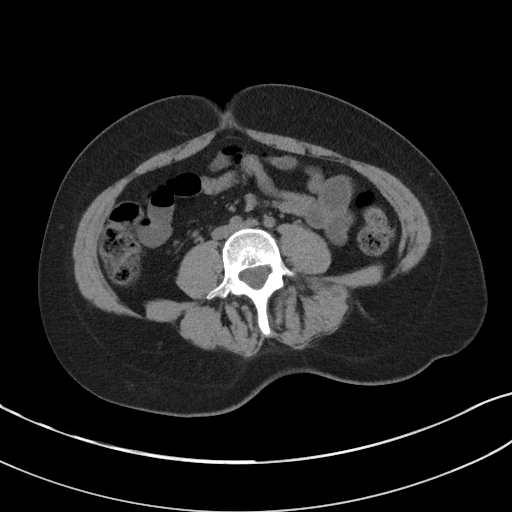
[im 51/95  soft-tissue]
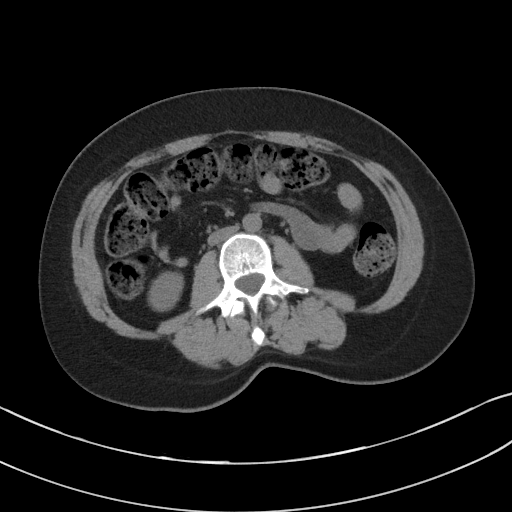
[im 55/95  soft-tissue]
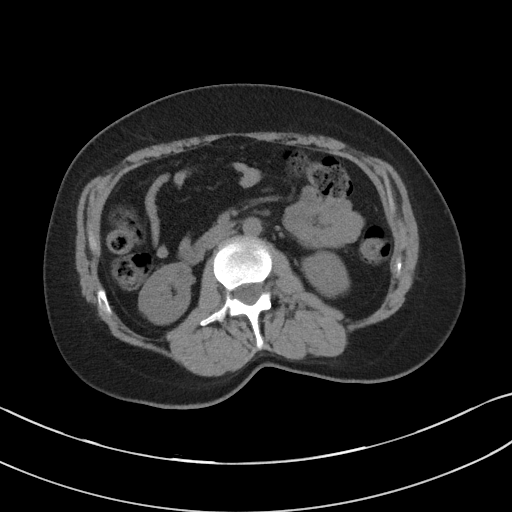
[im 55/95  bone]
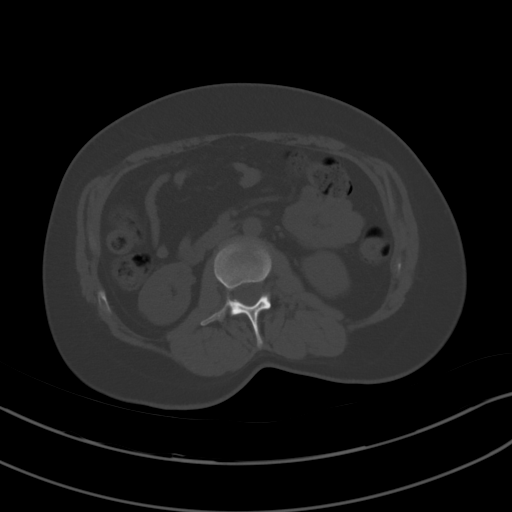
[im 63/95  soft-tissue]
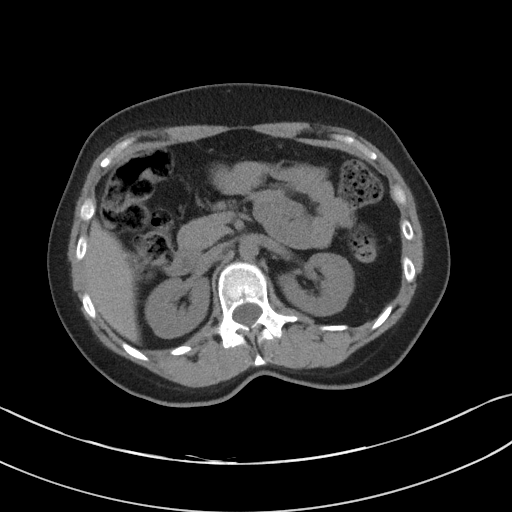
[im 71/95  soft-tissue]
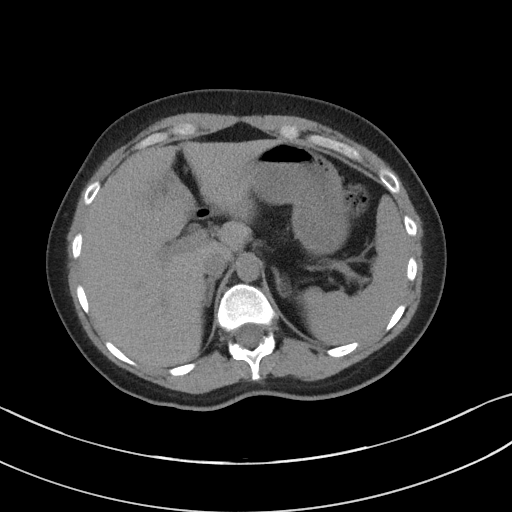
[im 75/95  soft-tissue]
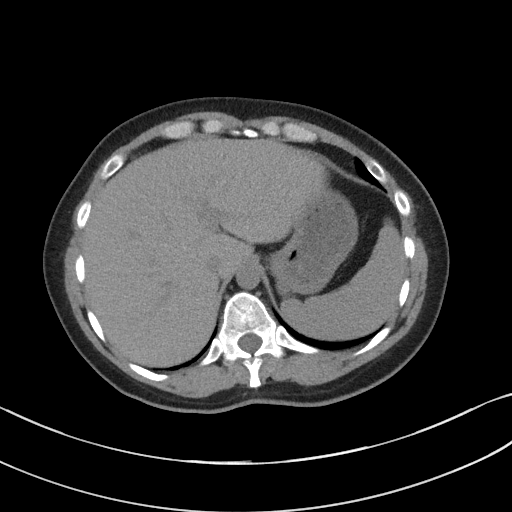
[im 83/95  soft-tissue]
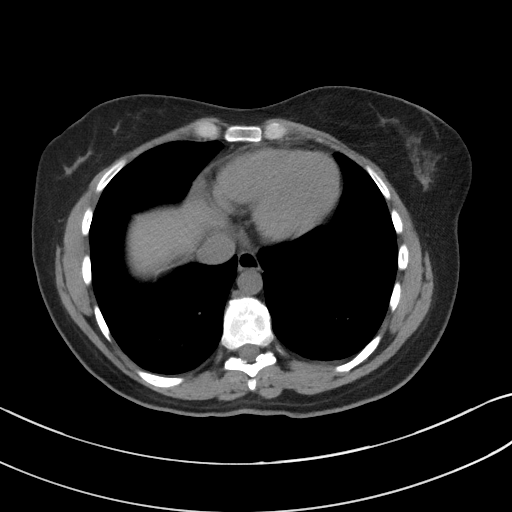
[im 91/95  soft-tissue]
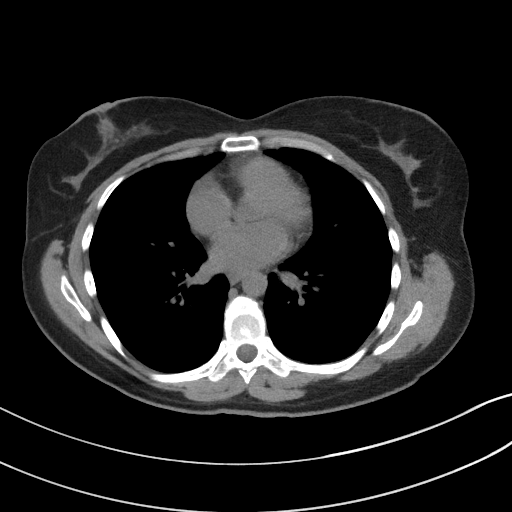

[Series 4: coronal st · coronal · 0.86mm/px · 3 of 65 slices shown]
[im 22/65  soft-tissue]
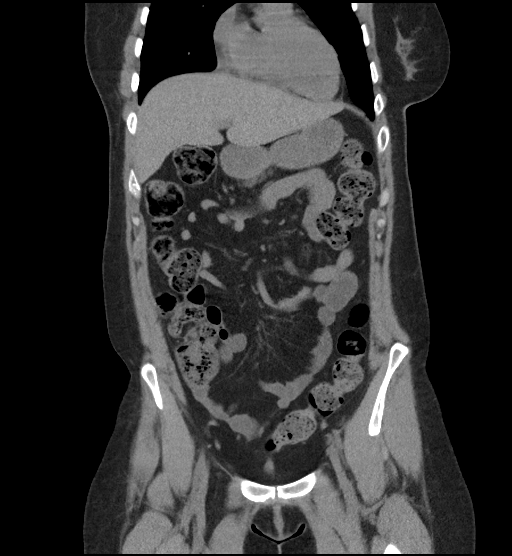
[im 29/65  soft-tissue]
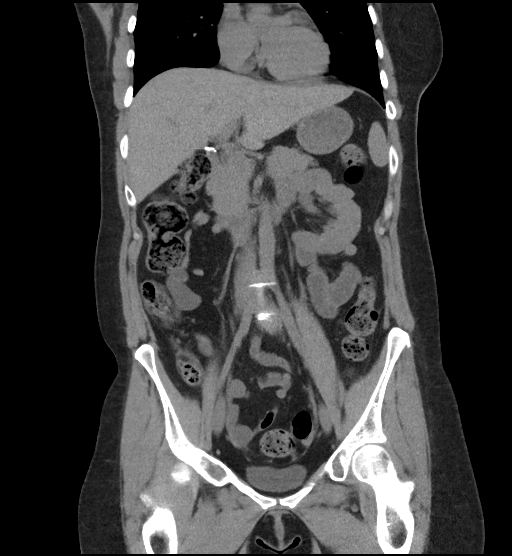
[im 36/65  soft-tissue]
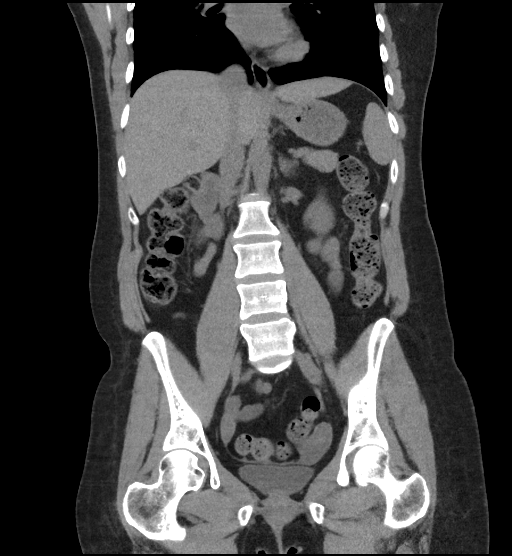

[17 of 46 positions shown; findings below may reference images not displayed]

FINDINGS: Lower chest:  No contributory findings.

Hepatobiliary: No focal liver abnormality.No evidence of biliary
obstruction or stone.

Pancreas: Unremarkable.

Spleen: Unremarkable.

Adrenals/Urinary Tract: Negative adrenals. No hydronephrosis or
ureteral stone. 2 right renal calculi measuring up to 3 mm at the
upper pole unremarkable bladder.

Stomach/Bowel: No obstruction. No appendicitis. Formed stool
throughout the colon.

Vascular/Lymphatic: No acute vascular abnormality. No mass or
adenopathy.

Reproductive:Hysterectomy. No explanation for vaginal bleeding.
Normal appearance of the bilateral ovaries.

Other: Trace peritoneal fluid in the pelvis, usually physiologic.

Musculoskeletal: No acute abnormalities.
IMPRESSION: 1. No acute finding.  No explanation for vaginal bleeding.
2. Right nephrolithiasis.

## 2020-08-13 DIAGNOSIS — R69 Illness, unspecified: Secondary | ICD-10-CM | POA: Diagnosis not present

## 2020-08-13 DIAGNOSIS — I10 Essential (primary) hypertension: Secondary | ICD-10-CM | POA: Diagnosis not present

## 2020-09-05 DIAGNOSIS — J309 Allergic rhinitis, unspecified: Secondary | ICD-10-CM | POA: Diagnosis not present

## 2020-09-05 DIAGNOSIS — K219 Gastro-esophageal reflux disease without esophagitis: Secondary | ICD-10-CM | POA: Diagnosis not present

## 2020-09-05 DIAGNOSIS — G8929 Other chronic pain: Secondary | ICD-10-CM | POA: Diagnosis not present

## 2020-09-05 DIAGNOSIS — M792 Neuralgia and neuritis, unspecified: Secondary | ICD-10-CM | POA: Diagnosis not present

## 2020-09-05 DIAGNOSIS — K589 Irritable bowel syndrome without diarrhea: Secondary | ICD-10-CM | POA: Diagnosis not present

## 2020-09-05 DIAGNOSIS — F419 Anxiety disorder, unspecified: Secondary | ICD-10-CM | POA: Diagnosis not present

## 2020-09-05 DIAGNOSIS — H04129 Dry eye syndrome of unspecified lacrimal gland: Secondary | ICD-10-CM | POA: Diagnosis not present

## 2020-09-05 DIAGNOSIS — R69 Illness, unspecified: Secondary | ICD-10-CM | POA: Diagnosis not present

## 2020-09-05 DIAGNOSIS — I10 Essential (primary) hypertension: Secondary | ICD-10-CM | POA: Diagnosis not present

## 2020-09-05 DIAGNOSIS — Z7722 Contact with and (suspected) exposure to environmental tobacco smoke (acute) (chronic): Secondary | ICD-10-CM | POA: Diagnosis not present

## 2020-09-09 DIAGNOSIS — K219 Gastro-esophageal reflux disease without esophagitis: Secondary | ICD-10-CM | POA: Diagnosis not present

## 2020-09-09 DIAGNOSIS — K581 Irritable bowel syndrome with constipation: Secondary | ICD-10-CM | POA: Diagnosis not present

## 2020-09-09 DIAGNOSIS — Z800 Family history of malignant neoplasm of digestive organs: Secondary | ICD-10-CM | POA: Diagnosis not present

## 2020-10-07 DIAGNOSIS — R52 Pain, unspecified: Secondary | ICD-10-CM | POA: Diagnosis not present

## 2020-10-07 DIAGNOSIS — R110 Nausea: Secondary | ICD-10-CM | POA: Diagnosis not present

## 2020-10-07 DIAGNOSIS — J029 Acute pharyngitis, unspecified: Secondary | ICD-10-CM | POA: Diagnosis not present

## 2020-10-07 DIAGNOSIS — K120 Recurrent oral aphthae: Secondary | ICD-10-CM | POA: Diagnosis not present

## 2020-11-19 DIAGNOSIS — H5213 Myopia, bilateral: Secondary | ICD-10-CM | POA: Diagnosis not present

## 2020-11-19 DIAGNOSIS — Z0100 Encounter for examination of eyes and vision without abnormal findings: Secondary | ICD-10-CM | POA: Diagnosis not present

## 2020-11-19 DIAGNOSIS — H52223 Regular astigmatism, bilateral: Secondary | ICD-10-CM | POA: Diagnosis not present

## 2020-11-19 DIAGNOSIS — H5702 Anisocoria: Secondary | ICD-10-CM | POA: Diagnosis not present

## 2020-11-21 ENCOUNTER — Other Ambulatory Visit (HOSPITAL_BASED_OUTPATIENT_CLINIC_OR_DEPARTMENT_OTHER): Payer: Self-pay | Admitting: Nurse Practitioner

## 2020-11-21 DIAGNOSIS — Z1231 Encounter for screening mammogram for malignant neoplasm of breast: Secondary | ICD-10-CM

## 2020-12-17 ENCOUNTER — Encounter (HOSPITAL_BASED_OUTPATIENT_CLINIC_OR_DEPARTMENT_OTHER): Payer: Self-pay

## 2020-12-17 ENCOUNTER — Ambulatory Visit (HOSPITAL_BASED_OUTPATIENT_CLINIC_OR_DEPARTMENT_OTHER)
Admission: RE | Admit: 2020-12-17 | Discharge: 2020-12-17 | Disposition: A | Discharge status: Home / Self Care | Payer: Medicare HMO | Source: Ambulatory Visit | Attending: Nurse Practitioner | Admitting: Nurse Practitioner

## 2020-12-17 ENCOUNTER — Other Ambulatory Visit: Payer: Self-pay

## 2020-12-17 DIAGNOSIS — Z1231 Encounter for screening mammogram for malignant neoplasm of breast: Secondary | ICD-10-CM | POA: Insufficient documentation

## 2021-02-13 DIAGNOSIS — G629 Polyneuropathy, unspecified: Secondary | ICD-10-CM | POA: Diagnosis not present

## 2021-02-13 DIAGNOSIS — I10 Essential (primary) hypertension: Secondary | ICD-10-CM | POA: Diagnosis not present

## 2021-02-13 DIAGNOSIS — R112 Nausea with vomiting, unspecified: Secondary | ICD-10-CM | POA: Diagnosis not present

## 2021-02-13 DIAGNOSIS — R69 Illness, unspecified: Secondary | ICD-10-CM | POA: Diagnosis not present

## 2021-02-22 DIAGNOSIS — R309 Painful micturition, unspecified: Secondary | ICD-10-CM | POA: Diagnosis not present

## 2021-02-22 DIAGNOSIS — N9489 Other specified conditions associated with female genital organs and menstrual cycle: Secondary | ICD-10-CM | POA: Diagnosis not present

## 2021-06-09 DIAGNOSIS — Z9189 Other specified personal risk factors, not elsewhere classified: Secondary | ICD-10-CM | POA: Diagnosis not present

## 2021-06-09 DIAGNOSIS — Z8742 Personal history of other diseases of the female genital tract: Secondary | ICD-10-CM | POA: Diagnosis not present

## 2021-08-14 DIAGNOSIS — G629 Polyneuropathy, unspecified: Secondary | ICD-10-CM | POA: Diagnosis not present

## 2021-08-14 DIAGNOSIS — I10 Essential (primary) hypertension: Secondary | ICD-10-CM | POA: Diagnosis not present

## 2021-08-14 DIAGNOSIS — R112 Nausea with vomiting, unspecified: Secondary | ICD-10-CM | POA: Diagnosis not present

## 2021-08-14 DIAGNOSIS — R69 Illness, unspecified: Secondary | ICD-10-CM | POA: Diagnosis not present

## 2021-10-13 DIAGNOSIS — E669 Obesity, unspecified: Secondary | ICD-10-CM | POA: Diagnosis not present

## 2021-10-13 DIAGNOSIS — I10 Essential (primary) hypertension: Secondary | ICD-10-CM | POA: Diagnosis not present

## 2021-10-13 DIAGNOSIS — R69 Illness, unspecified: Secondary | ICD-10-CM | POA: Diagnosis not present

## 2021-10-13 DIAGNOSIS — G43909 Migraine, unspecified, not intractable, without status migrainosus: Secondary | ICD-10-CM | POA: Diagnosis not present

## 2021-10-13 DIAGNOSIS — Z6830 Body mass index (BMI) 30.0-30.9, adult: Secondary | ICD-10-CM | POA: Diagnosis not present

## 2021-10-13 DIAGNOSIS — K5900 Constipation, unspecified: Secondary | ICD-10-CM | POA: Diagnosis not present

## 2021-10-13 DIAGNOSIS — Z791 Long term (current) use of non-steroidal anti-inflammatories (NSAID): Secondary | ICD-10-CM | POA: Diagnosis not present

## 2021-10-13 DIAGNOSIS — Z604 Social exclusion and rejection: Secondary | ICD-10-CM | POA: Diagnosis not present

## 2021-10-13 DIAGNOSIS — R110 Nausea: Secondary | ICD-10-CM | POA: Diagnosis not present

## 2021-10-13 DIAGNOSIS — F411 Generalized anxiety disorder: Secondary | ICD-10-CM | POA: Diagnosis not present

## 2022-02-12 DIAGNOSIS — R238 Other skin changes: Secondary | ICD-10-CM | POA: Diagnosis not present

## 2022-02-12 DIAGNOSIS — R69 Illness, unspecified: Secondary | ICD-10-CM | POA: Diagnosis not present

## 2022-02-12 DIAGNOSIS — E785 Hyperlipidemia, unspecified: Secondary | ICD-10-CM | POA: Diagnosis not present

## 2022-02-12 DIAGNOSIS — K219 Gastro-esophageal reflux disease without esophagitis: Secondary | ICD-10-CM | POA: Diagnosis not present

## 2022-02-12 DIAGNOSIS — I10 Essential (primary) hypertension: Secondary | ICD-10-CM | POA: Diagnosis not present

## 2022-02-12 DIAGNOSIS — R202 Paresthesia of skin: Secondary | ICD-10-CM | POA: Diagnosis not present

## 2022-10-29 DIAGNOSIS — R202 Paresthesia of skin: Secondary | ICD-10-CM | POA: Diagnosis not present

## 2022-10-29 DIAGNOSIS — K219 Gastro-esophageal reflux disease without esophagitis: Secondary | ICD-10-CM | POA: Diagnosis not present

## 2022-10-29 DIAGNOSIS — I10 Essential (primary) hypertension: Secondary | ICD-10-CM | POA: Diagnosis not present

## 2022-11-20 DIAGNOSIS — M25531 Pain in right wrist: Secondary | ICD-10-CM | POA: Diagnosis not present

## 2022-11-20 DIAGNOSIS — R6250 Unspecified lack of expected normal physiological development in childhood: Secondary | ICD-10-CM | POA: Diagnosis not present

## 2022-11-20 DIAGNOSIS — S5291XA Unspecified fracture of right forearm, initial encounter for closed fracture: Secondary | ICD-10-CM | POA: Diagnosis not present

## 2022-11-20 DIAGNOSIS — S52611A Displaced fracture of right ulna styloid process, initial encounter for closed fracture: Secondary | ICD-10-CM | POA: Diagnosis not present

## 2022-11-20 DIAGNOSIS — Y92007 Garden or yard of unspecified non-institutional (private) residence as the place of occurrence of the external cause: Secondary | ICD-10-CM | POA: Diagnosis not present

## 2022-11-20 DIAGNOSIS — W010XXA Fall on same level from slipping, tripping and stumbling without subsequent striking against object, initial encounter: Secondary | ICD-10-CM | POA: Diagnosis not present

## 2022-11-20 DIAGNOSIS — M7989 Other specified soft tissue disorders: Secondary | ICD-10-CM | POA: Diagnosis not present

## 2022-11-20 DIAGNOSIS — S52351A Displaced comminuted fracture of shaft of radius, right arm, initial encounter for closed fracture: Secondary | ICD-10-CM | POA: Diagnosis not present

## 2022-11-20 DIAGNOSIS — S52501A Unspecified fracture of the lower end of right radius, initial encounter for closed fracture: Secondary | ICD-10-CM | POA: Diagnosis not present

## 2022-11-20 DIAGNOSIS — Y93H2 Activity, gardening and landscaping: Secondary | ICD-10-CM | POA: Diagnosis not present

## 2022-11-21 DIAGNOSIS — S5291XA Unspecified fracture of right forearm, initial encounter for closed fracture: Secondary | ICD-10-CM | POA: Diagnosis not present

## 2022-11-22 DIAGNOSIS — S52614A Nondisplaced fracture of right ulna styloid process, initial encounter for closed fracture: Secondary | ICD-10-CM | POA: Diagnosis not present

## 2022-11-22 DIAGNOSIS — S52611A Displaced fracture of right ulna styloid process, initial encounter for closed fracture: Secondary | ICD-10-CM | POA: Diagnosis not present

## 2022-11-22 DIAGNOSIS — S52571A Other intraarticular fracture of lower end of right radius, initial encounter for closed fracture: Secondary | ICD-10-CM | POA: Diagnosis not present

## 2022-11-24 DIAGNOSIS — S52501A Unspecified fracture of the lower end of right radius, initial encounter for closed fracture: Secondary | ICD-10-CM | POA: Diagnosis not present

## 2022-11-27 DIAGNOSIS — I10 Essential (primary) hypertension: Secondary | ICD-10-CM | POA: Diagnosis not present

## 2022-11-27 DIAGNOSIS — K219 Gastro-esophageal reflux disease without esophagitis: Secondary | ICD-10-CM | POA: Diagnosis not present

## 2022-11-27 DIAGNOSIS — S52611A Displaced fracture of right ulna styloid process, initial encounter for closed fracture: Secondary | ICD-10-CM | POA: Diagnosis not present

## 2022-11-27 DIAGNOSIS — S52501A Unspecified fracture of the lower end of right radius, initial encounter for closed fracture: Secondary | ICD-10-CM | POA: Diagnosis not present

## 2022-11-27 DIAGNOSIS — Z91011 Allergy to milk products: Secondary | ICD-10-CM | POA: Diagnosis not present

## 2022-11-27 DIAGNOSIS — S52571A Other intraarticular fracture of lower end of right radius, initial encounter for closed fracture: Secondary | ICD-10-CM | POA: Diagnosis not present

## 2022-11-27 DIAGNOSIS — Z79899 Other long term (current) drug therapy: Secondary | ICD-10-CM | POA: Diagnosis not present

## 2022-11-27 DIAGNOSIS — G8918 Other acute postprocedural pain: Secondary | ICD-10-CM | POA: Diagnosis not present

## 2022-11-27 DIAGNOSIS — Z881 Allergy status to other antibiotic agents status: Secondary | ICD-10-CM | POA: Diagnosis not present

## 2022-11-27 DIAGNOSIS — Z91040 Latex allergy status: Secondary | ICD-10-CM | POA: Diagnosis not present

## 2022-12-10 DIAGNOSIS — S52501A Unspecified fracture of the lower end of right radius, initial encounter for closed fracture: Secondary | ICD-10-CM | POA: Diagnosis not present

## 2023-01-11 DIAGNOSIS — S52501A Unspecified fracture of the lower end of right radius, initial encounter for closed fracture: Secondary | ICD-10-CM | POA: Diagnosis not present

## 2023-01-14 DIAGNOSIS — M25531 Pain in right wrist: Secondary | ICD-10-CM | POA: Diagnosis not present

## 2023-01-14 DIAGNOSIS — R29898 Other symptoms and signs involving the musculoskeletal system: Secondary | ICD-10-CM | POA: Diagnosis not present

## 2023-01-14 DIAGNOSIS — Z9889 Other specified postprocedural states: Secondary | ICD-10-CM | POA: Diagnosis not present

## 2023-01-14 DIAGNOSIS — M6281 Muscle weakness (generalized): Secondary | ICD-10-CM | POA: Diagnosis not present

## 2023-01-14 DIAGNOSIS — R600 Localized edema: Secondary | ICD-10-CM | POA: Diagnosis not present

## 2023-01-14 DIAGNOSIS — S52501D Unspecified fracture of the lower end of right radius, subsequent encounter for closed fracture with routine healing: Secondary | ICD-10-CM | POA: Diagnosis not present

## 2023-01-19 DIAGNOSIS — M6281 Muscle weakness (generalized): Secondary | ICD-10-CM | POA: Diagnosis not present

## 2023-01-19 DIAGNOSIS — R29898 Other symptoms and signs involving the musculoskeletal system: Secondary | ICD-10-CM | POA: Diagnosis not present

## 2023-01-19 DIAGNOSIS — M25531 Pain in right wrist: Secondary | ICD-10-CM | POA: Diagnosis not present

## 2023-01-19 DIAGNOSIS — S52501D Unspecified fracture of the lower end of right radius, subsequent encounter for closed fracture with routine healing: Secondary | ICD-10-CM | POA: Diagnosis not present

## 2023-01-19 DIAGNOSIS — Z9889 Other specified postprocedural states: Secondary | ICD-10-CM | POA: Diagnosis not present

## 2023-01-21 DIAGNOSIS — R29898 Other symptoms and signs involving the musculoskeletal system: Secondary | ICD-10-CM | POA: Diagnosis not present

## 2023-01-21 DIAGNOSIS — Z9889 Other specified postprocedural states: Secondary | ICD-10-CM | POA: Diagnosis not present

## 2023-01-21 DIAGNOSIS — S52501D Unspecified fracture of the lower end of right radius, subsequent encounter for closed fracture with routine healing: Secondary | ICD-10-CM | POA: Diagnosis not present

## 2023-01-21 DIAGNOSIS — M25531 Pain in right wrist: Secondary | ICD-10-CM | POA: Diagnosis not present

## 2023-01-21 DIAGNOSIS — M6281 Muscle weakness (generalized): Secondary | ICD-10-CM | POA: Diagnosis not present

## 2023-02-08 DIAGNOSIS — S52501A Unspecified fracture of the lower end of right radius, initial encounter for closed fracture: Secondary | ICD-10-CM | POA: Diagnosis not present

## 2023-02-09 DIAGNOSIS — M6281 Muscle weakness (generalized): Secondary | ICD-10-CM | POA: Diagnosis not present

## 2023-02-09 DIAGNOSIS — R29898 Other symptoms and signs involving the musculoskeletal system: Secondary | ICD-10-CM | POA: Diagnosis not present

## 2023-02-09 DIAGNOSIS — Z9889 Other specified postprocedural states: Secondary | ICD-10-CM | POA: Diagnosis not present

## 2023-02-09 DIAGNOSIS — M25531 Pain in right wrist: Secondary | ICD-10-CM | POA: Diagnosis not present

## 2023-02-09 DIAGNOSIS — S52501D Unspecified fracture of the lower end of right radius, subsequent encounter for closed fracture with routine healing: Secondary | ICD-10-CM | POA: Diagnosis not present

## 2023-02-09 DIAGNOSIS — R600 Localized edema: Secondary | ICD-10-CM | POA: Diagnosis not present

## 2023-02-11 DIAGNOSIS — R29898 Other symptoms and signs involving the musculoskeletal system: Secondary | ICD-10-CM | POA: Diagnosis not present

## 2023-02-11 DIAGNOSIS — S52501D Unspecified fracture of the lower end of right radius, subsequent encounter for closed fracture with routine healing: Secondary | ICD-10-CM | POA: Diagnosis not present

## 2023-02-11 DIAGNOSIS — Z9889 Other specified postprocedural states: Secondary | ICD-10-CM | POA: Diagnosis not present

## 2023-02-11 DIAGNOSIS — M25531 Pain in right wrist: Secondary | ICD-10-CM | POA: Diagnosis not present

## 2023-02-11 DIAGNOSIS — M6281 Muscle weakness (generalized): Secondary | ICD-10-CM | POA: Diagnosis not present

## 2023-02-18 DIAGNOSIS — Z9889 Other specified postprocedural states: Secondary | ICD-10-CM | POA: Diagnosis not present

## 2023-02-18 DIAGNOSIS — S52501D Unspecified fracture of the lower end of right radius, subsequent encounter for closed fracture with routine healing: Secondary | ICD-10-CM | POA: Diagnosis not present

## 2023-02-18 DIAGNOSIS — R29898 Other symptoms and signs involving the musculoskeletal system: Secondary | ICD-10-CM | POA: Diagnosis not present

## 2023-02-18 DIAGNOSIS — M25531 Pain in right wrist: Secondary | ICD-10-CM | POA: Diagnosis not present

## 2023-02-18 DIAGNOSIS — M6281 Muscle weakness (generalized): Secondary | ICD-10-CM | POA: Diagnosis not present

## 2023-02-23 DIAGNOSIS — R29898 Other symptoms and signs involving the musculoskeletal system: Secondary | ICD-10-CM | POA: Diagnosis not present

## 2023-02-23 DIAGNOSIS — S52501D Unspecified fracture of the lower end of right radius, subsequent encounter for closed fracture with routine healing: Secondary | ICD-10-CM | POA: Diagnosis not present

## 2023-02-23 DIAGNOSIS — M25531 Pain in right wrist: Secondary | ICD-10-CM | POA: Diagnosis not present

## 2023-02-23 DIAGNOSIS — Z9889 Other specified postprocedural states: Secondary | ICD-10-CM | POA: Diagnosis not present

## 2023-02-23 DIAGNOSIS — M6281 Muscle weakness (generalized): Secondary | ICD-10-CM | POA: Diagnosis not present

## 2023-03-25 DIAGNOSIS — S52501A Unspecified fracture of the lower end of right radius, initial encounter for closed fracture: Secondary | ICD-10-CM | POA: Diagnosis not present

## 2023-03-25 NOTE — Progress Notes (Signed)
 PATIENT: Carla Jackson MRN:  [id number] DOB:  June 19, 1980 DATE OF SERVICE:  03/25/2023  Date of Procedure:  11/27/2022   Preoperative Diagnosis:   Right intra-articular distal radius fracture   Postoperative Diagnosis:  Same   Procedures Performed: Open reduction internal fixation right distal radius fracture   SUBJECTIVE:   Carla Jackson is a 42 y.o. female who presents today for re-evaluation of her Right wrist.  She is 4 months from surgery.  She reports that she is doing well and does continue to improve.  She has occasional aches and pains but otherwise feels that she is doing well.  She has completed therapy.  Past Medical History: Reviewed and updated Family History: Reviewed and updated Social History: Reviewed and updated  Review of Systems: See below for the review of systems which I have reviewed, edited and corrected.  Denies fever/chills, swelling, weakness, changes in mood.  OBJECTIVE:   VITALS:  There were no vitals taken for this visit.  PHYSICAL EXAMINATION: General: alert & oriented, no acute distress.  Appears stated age. HEENT: normocephalic, atraumatic CV: extremities warm, well-perfused Resp: nonlabored Abdomen: soft  Right Wrist: Incision healed Soft tissue swelling No pain with wrist and finger ROM Intact AIN/PIN/ulnar nerve motor function Sensation intact to light touch median/radial/ulnar nerve distribution 2+ radial pulse Mild swelling  IMAGING: None  ASSESSMENT:   1. Closed fracture of distal end of right radius, unspecified fracture morphology, initial encounter     PLAN:  Patient is now 4 months status post the above she is doing well.  She does continue to improve.  She is made excellent progress.  She will continue activity as tolerated.  No restrictions.  Follow with me as needed.    Risks, benefits, and alternatives of the medications and treatment plan prescribed today were discussed.  All questions were answered  and the patient expressed understanding and agreement with the treatment plan.  Follow-up as discussed or as needed if any worsening symptoms or change in condition.   Follow-up: as needed  This note was transcribed with voice recognition software.  Please excuse transcription errors.  Author:  Murray KATHEE Primas, MD 03/25/2023 9:38 AM   Review of Systems  All other systems reviewed and are negative.

## 2023-04-14 DIAGNOSIS — Z9181 History of falling: Secondary | ICD-10-CM | POA: Diagnosis not present

## 2023-10-07 ENCOUNTER — Other Ambulatory Visit (HOSPITAL_BASED_OUTPATIENT_CLINIC_OR_DEPARTMENT_OTHER): Payer: Self-pay | Admitting: Family Medicine

## 2023-10-07 DIAGNOSIS — Z131 Encounter for screening for diabetes mellitus: Secondary | ICD-10-CM | POA: Diagnosis not present

## 2023-10-07 DIAGNOSIS — Z1231 Encounter for screening mammogram for malignant neoplasm of breast: Secondary | ICD-10-CM

## 2023-10-07 DIAGNOSIS — Z13220 Encounter for screening for lipoid disorders: Secondary | ICD-10-CM | POA: Diagnosis not present

## 2023-10-07 DIAGNOSIS — Z0000 Encounter for general adult medical examination without abnormal findings: Secondary | ICD-10-CM | POA: Diagnosis not present

## 2023-10-12 ENCOUNTER — Encounter (HOSPITAL_BASED_OUTPATIENT_CLINIC_OR_DEPARTMENT_OTHER): Payer: Self-pay

## 2023-10-12 ENCOUNTER — Ambulatory Visit (HOSPITAL_BASED_OUTPATIENT_CLINIC_OR_DEPARTMENT_OTHER)
Admission: RE | Admit: 2023-10-12 | Discharge: 2023-10-12 | Disposition: A | Discharge status: Home / Self Care | Source: Ambulatory Visit | Attending: Family Medicine | Admitting: Family Medicine

## 2023-10-12 DIAGNOSIS — Z1231 Encounter for screening mammogram for malignant neoplasm of breast: Secondary | ICD-10-CM | POA: Diagnosis not present

## 2023-10-14 ENCOUNTER — Ambulatory Visit (HOSPITAL_BASED_OUTPATIENT_CLINIC_OR_DEPARTMENT_OTHER)

## 2023-10-15 ENCOUNTER — Other Ambulatory Visit: Payer: Self-pay | Admitting: Family Medicine

## 2023-10-15 DIAGNOSIS — R928 Other abnormal and inconclusive findings on diagnostic imaging of breast: Secondary | ICD-10-CM

## 2023-11-09 ENCOUNTER — Other Ambulatory Visit

## 2023-11-09 ENCOUNTER — Other Ambulatory Visit: Payer: Self-pay | Admitting: Family Medicine

## 2023-11-09 ENCOUNTER — Ambulatory Visit
Admission: RE | Admit: 2023-11-09 | Discharge: 2023-11-09 | Disposition: A | Discharge status: Home / Self Care | Source: Ambulatory Visit | Attending: Family Medicine | Admitting: Family Medicine

## 2023-11-09 DIAGNOSIS — R928 Other abnormal and inconclusive findings on diagnostic imaging of breast: Secondary | ICD-10-CM

## 2023-11-09 DIAGNOSIS — N6321 Unspecified lump in the left breast, upper outer quadrant: Secondary | ICD-10-CM | POA: Diagnosis not present

## 2023-11-09 DIAGNOSIS — N6320 Unspecified lump in the left breast, unspecified quadrant: Secondary | ICD-10-CM

## 2023-11-16 ENCOUNTER — Ambulatory Visit
Admission: RE | Admit: 2023-11-16 | Discharge: 2023-11-16 | Disposition: A | Discharge status: Home / Self Care | Source: Ambulatory Visit | Attending: Family Medicine | Admitting: Family Medicine

## 2023-11-16 DIAGNOSIS — C50412 Malignant neoplasm of upper-outer quadrant of left female breast: Secondary | ICD-10-CM | POA: Diagnosis not present

## 2023-11-16 DIAGNOSIS — N6321 Unspecified lump in the left breast, upper outer quadrant: Secondary | ICD-10-CM | POA: Diagnosis not present

## 2023-11-16 DIAGNOSIS — N6320 Unspecified lump in the left breast, unspecified quadrant: Secondary | ICD-10-CM

## 2023-11-16 HISTORY — PX: BREAST BIOPSY: SHX20

## 2023-11-17 LAB — SURGICAL PATHOLOGY

## 2023-11-30 ENCOUNTER — Other Ambulatory Visit: Payer: Self-pay | Admitting: Surgery

## 2023-11-30 DIAGNOSIS — C50412 Malignant neoplasm of upper-outer quadrant of left female breast: Secondary | ICD-10-CM | POA: Diagnosis not present

## 2023-11-30 DIAGNOSIS — Z853 Personal history of malignant neoplasm of breast: Secondary | ICD-10-CM

## 2023-11-30 NOTE — Progress Notes (Signed)
 REFERRING PHYSICIAN:  Crecencio Lacks, NP PROVIDER:  VICENTA DASIE POLI, MD MRN: [id number] DOB: 1981-02-06 DATE OF ENCOUNTER: 11/30/2023 Subjective    Chief Complaint: New Consultation (Lt breast cancer)   History of Present Illness: Carla Jackson is a 43 y.o. female who is seen today as an office consultation for evaluation of New Consultation (Lt breast cancer)   This is a 43 year old female who is referred here for the recent diagnosis of left breast cancer.  She had undergone her screening mammography when calcifications were seen in the right breast and an asymmetry was seen in the left breast.  Further imaging of both breast ruled out the calcifications in the right breast but in the left breast there was a 9 mm mass at the 1 o'clock position 10 cm from the nipple at the area of concern.  A biopsy of the mass showed invasive ductal carcinoma which was grade 3.  It was 40% ER positive, PR negative, had a Ki-67 of 50% and was also HER2 negative.  Ultrasound the axilla was unremarkable.  She has had no previous problems regarding her breast.  There is a history of ovarian cancer in her mother and sister.  There is no history of breast cancer in the family.  She has had a previous hysterectomy.  She has no cardiopulmonary issues.  She denies nipple discharge.    Review of Systems: A complete review of systems was obtained from the patient.  I have reviewed this information and discussed as appropriate with the patient.  See HPI as well for other ROS.  ROS   Medical History: Past Medical History:  Diagnosis Date  . Anxiety   . Arthritis   . GERD (gastroesophageal reflux disease)   . Hypertension     There is no problem list on file for this patient.   Past Surgical History:  Procedure Laterality Date  . Broken wrist    . CHOLECYSTECTOMY    . HYSTERECTOMY       Allergies  Allergen Reactions  . Erythromycin Hives  . Latex Itching and Other (See Comments)    reddness   . Erythromycin Ethylsuccinate Rash  . Lactose Nausea And Vomiting    Current Outpatient Medications on File Prior to Visit  Medication Sig Dispense Refill  . clonazePAM (KLONOPIN) 0.5 MG tablet TAKE ONE TABLET BY MOUTH EVERY MORNING AND TWO TABLETS IN THE EVENING    . gabapentin (NEURONTIN) 100 MG capsule Take 100 mg by mouth 2 (two) times daily    . nebivoloL (BYSTOLIC) 2.5 MG tablet Take 2.5 mg by mouth once daily    . omeprazole (PRILOSEC) 20 MG DR capsule TAKE ONE CAPSULE BY MOUTH 30 MINUTES BEFORE morning meal.    . ondansetron  (ZOFRAN ) 4 MG tablet Take 4 mg by mouth every 8 (eight) hours as needed    . propranoloL (INDERAL) 20 MG tablet TAKE ONE TABLET BY MOUTH AS NEEDED FOR panic attacks    . venlafaxine (EFFEXOR-XR) 75 MG XR capsule take one capsule by mouth twice daily with food     No current facility-administered medications on file prior to visit.    Family History  Problem Relation Age of Onset  . Colon cancer Mother   . Obesity Sister   . High blood pressure (Hypertension) Sister   . Deep vein thrombosis (DVT or abnormal blood clot formation) Sister   . Colon cancer Sister      Social History   Tobacco Use  Smoking Status  Never  Smokeless Tobacco Never     Social History   Socioeconomic History  . Marital status: Single  Tobacco Use  . Smoking status: Never  . Smokeless tobacco: Never  Vaping Use  . Vaping status: Never Used  Substance and Sexual Activity  . Alcohol use: Never  . Drug use: Never   Social Drivers of Health   Stress: No Stress Concern Present (11/27/2022)   Received from [facility] of Occupational Health - Occupational Stress Questionnaire   . Feeling of Stress : Not at all   Received from [facility]   Social Network    Objective:   Vitals:   11/30/23 1453 11/30/23 1454  BP: 122/84   Pulse: 95   Temp: 36.7 C (98.1 F)   SpO2: 99%   Weight: 68.2 kg (150 lb 6.4 oz)   Height: 147.3 cm (4' 10)    PainSc:  0-No pain  PainLoc:  Breast    Body mass index is 31.43 kg/m.  Physical Exam   She appears well on exam.  There are no palpable masses in either breast.  There is no axillary adenopathy on either side.  The nipple and areolar complexes appear normal.  Labs, Imaging and Diagnostic Testing: I have reviewed her mammograms, ultrasound, and pathology results  Assessment and Plan:     Diagnoses and all orders for this visit:  Invasive ductal carcinoma of breast, left (CMS/HHS-HCC) -     Ambulatory Referral to Radiation Oncology -     Ambulatory Referral to Oncology-Medical -     Ambulatory Referral to Physical Therapy -     Ambulatory Referral to Cancer Genetics - [id number]    I gave her and her mother-in-law a copy of the pathology results we discussed breast cancer in detail.  We discussed the multidisciplinary approach to cancer.  From a surgical standpoint we discussed breast conservation versus mastectomy.  She is definitely interested in breast conservation.  I next discussed proceeding with a radioactive seed guided left breast lumpectomy and sentinel lymph node biopsy.  I explained the surgical procedure in detail as well as the need to remove some lymph nodes for evaluation despite the normal ultrasound.  We discussed the risks which includes but is not limited to bleeding, infection, injury to surrounding structures, the need for further procedures if the lymph nodes or margins are positive, the eventual potential need for a Port-A-Cath, cardiopulmonary issues with anesthesia, DVT, etc. Again, she will be referred to the cancer center to see medical and radiation oncology as well as genetics and physical therapy.  I discussed the reasons for this with her in detail as well. At this point, she understands and agrees with the current plans.  Surgery will be scheduled  Complex medical decision making    VICENTA DASIE POLI, MD    I spent a total of 55 minutes in both  face-to-face and non-face-to-face activities, excluding procedures performed, for this visit on the date of this encounter.

## 2023-12-02 ENCOUNTER — Other Ambulatory Visit: Payer: Self-pay | Admitting: Surgery

## 2023-12-02 DIAGNOSIS — Z853 Personal history of malignant neoplasm of breast: Secondary | ICD-10-CM

## 2023-12-16 DIAGNOSIS — Z170 Estrogen receptor positive status [ER+]: Secondary | ICD-10-CM | POA: Diagnosis not present

## 2023-12-16 DIAGNOSIS — Z17421 Hormone receptor negative with human epidermal growth factor receptor 2 negative status: Secondary | ICD-10-CM | POA: Diagnosis not present

## 2023-12-16 DIAGNOSIS — C50912 Malignant neoplasm of unspecified site of left female breast: Secondary | ICD-10-CM | POA: Diagnosis not present

## 2023-12-16 DIAGNOSIS — C50412 Malignant neoplasm of upper-outer quadrant of left female breast: Secondary | ICD-10-CM | POA: Diagnosis not present

## 2023-12-16 DIAGNOSIS — Z1721 Progesterone receptor positive status: Secondary | ICD-10-CM | POA: Diagnosis not present

## 2023-12-20 DIAGNOSIS — Z5111 Encounter for antineoplastic chemotherapy: Secondary | ICD-10-CM | POA: Diagnosis not present

## 2023-12-20 DIAGNOSIS — C50412 Malignant neoplasm of upper-outer quadrant of left female breast: Secondary | ICD-10-CM | POA: Diagnosis not present

## 2023-12-20 DIAGNOSIS — Z170 Estrogen receptor positive status [ER+]: Secondary | ICD-10-CM | POA: Diagnosis not present

## 2023-12-20 DIAGNOSIS — C50912 Malignant neoplasm of unspecified site of left female breast: Secondary | ICD-10-CM | POA: Diagnosis not present

## 2023-12-29 ENCOUNTER — Encounter (HOSPITAL_BASED_OUTPATIENT_CLINIC_OR_DEPARTMENT_OTHER): Payer: Self-pay | Admitting: Surgery

## 2024-01-04 ENCOUNTER — Ambulatory Visit
Admission: RE | Admit: 2024-01-04 | Discharge: 2024-01-04 | Disposition: A | Discharge status: Home / Self Care | Source: Ambulatory Visit | Attending: Surgery | Admitting: Surgery

## 2024-01-04 ENCOUNTER — Other Ambulatory Visit: Payer: Self-pay | Admitting: Surgery

## 2024-01-04 ENCOUNTER — Encounter (HOSPITAL_BASED_OUTPATIENT_CLINIC_OR_DEPARTMENT_OTHER)
Admission: RE | Admit: 2024-01-04 | Discharge: 2024-01-04 | Disposition: A | Discharge status: Home / Self Care | Source: Ambulatory Visit | Attending: Surgery | Admitting: Surgery

## 2024-01-04 DIAGNOSIS — R92332 Mammographic heterogeneous density, left breast: Secondary | ICD-10-CM | POA: Diagnosis not present

## 2024-01-04 DIAGNOSIS — Z853 Personal history of malignant neoplasm of breast: Secondary | ICD-10-CM

## 2024-01-04 DIAGNOSIS — Z01810 Encounter for preprocedural cardiovascular examination: Secondary | ICD-10-CM | POA: Insufficient documentation

## 2024-01-04 DIAGNOSIS — C50912 Malignant neoplasm of unspecified site of left female breast: Secondary | ICD-10-CM | POA: Diagnosis not present

## 2024-01-04 HISTORY — PX: BREAST BIOPSY: SHX20

## 2024-01-04 MED ORDER — CHLORHEXIDINE GLUCONATE CLOTH 2 % EX PADS: 6.0000 | MEDICATED_PAD | Freq: Once | CUTANEOUS | Status: DC

## 2024-01-04 MED ORDER — ENSURE PRE-SURGERY PO LIQD: 296.0000 mL | Freq: Once | ORAL | Status: DC

## 2024-01-04 NOTE — Progress Notes (Signed)

## 2024-01-05 NOTE — H&P (Signed)
 REFERRING PHYSICIAN: Crecencio Lacks, NP PROVIDER: VICENTA DASIE POLI, MD MRN: [id number] DOB: 1981-02-19  Subjective   Chief Complaint: New Consultation (Lt breast cancer)  History of Present Illness: Carla Jackson is a 43 y.o. female who is seen y as an office consultation for evaluation of New Consultation (Lt breast cancer)  This is a 43 year old female who is referred here for the recent diagnosis of left breast cancer. She had undergone her screening mammography when calcifications were seen in the right breast and an asymmetry was seen in the left breast. Further imaging of both breast ruled out the calcifications in the right breast but in the left breast there was a 9 mm mass at the 1 o'clock position 10 cm from the nipple at the area of concern. A biopsy of the mass showed invasive ductal carcinoma which was grade 3. It was 40% ER positive, PR negative, had a Ki-67 of 50% and was also HER2 negative. Ultrasound the axilla was unremarkable. She has had no previous problems regarding her breast. There is a history of ovarian cancer in her mother and sister. There is no history of breast cancer in the family. She has had a previous hysterectomy. She has no cardiopulmonary issues. She denies nipple discharge.  Review of Systems: A complete review of systems was obtained from the patient. I have reviewed this information and discussed as appropriate with the patient. See HPI as well for other ROS.  ROS   Medical History: Past Medical History:  Diagnosis Date  Anxiety  Arthritis  GERD (gastroesophageal reflux disease)  Hypertension   There is no problem list on file for this patient.  Past Surgical History:  Procedure Laterality Date  Broken wrist  CHOLECYSTECTOMY  HYSTERECTOMY    Allergies  Allergen Reactions  Erythromycin Hives  Latex Itching and Other (See Comments)  reddness  Erythromycin Ethylsuccinate Rash  Lactose Nausea And Vomiting   Current Outpatient  Medications on File Prior to Visit  Medication Sig Dispense Refill  clonazePAM (KLONOPIN) 0.5 MG tablet TAKE ONE TABLET BY MOUTH EVERY MORNING AND TWO TABLETS IN THE EVENING  gabapentin (NEURONTIN) 100 MG capsule Take 100 mg by mouth 2 (two) times daily  nebivoloL (BYSTOLIC) 2.5 MG tablet Take 2.5 mg by mouth once daily  omeprazole (PRILOSEC) 20 MG DR capsule TAKE ONE CAPSULE BY MOUTH 30 MINUTES BEFORE morning meal.  ondansetron  (ZOFRAN ) 4 MG tablet Take 4 mg by mouth every 8 (eight) hours as needed  propranoloL (INDERAL) 20 MG tablet TAKE ONE TABLET BY MOUTH AS NEEDED FOR panic attacks  venlafaxine (EFFEXOR-XR) 75 MG XR capsule take one capsule by mouth twice daily with food   No current facility-administered medications on file prior to visit.   Family History  Problem Relation Age of Onset  Colon cancer Mother  Obesity Sister  High blood pressure (Hypertension) Sister  Deep vein thrombosis (DVT or abnormal blood clot formation) Sister  Colon cancer Sister    Social History   Tobacco Use  Smoking Status Never  Smokeless Tobacco Never    Social History   Socioeconomic History  Marital status: Single  Tobacco Use  Smoking status: Never  Smokeless tobacco: Never  Vaping Use  Vaping status: Never Used  Substance and Sexual Activity  Alcohol use: Never  Drug use: Never   Social Drivers of Health   Stress: No Stress Concern Present (11/27/2022)  Received from [facility] of Occupational Health - Occupational Stress Questionnaire  Feeling of Stress : Not  at all  Received from [facility]  Social Network   Objective:   Vitals:   BP: 122/84  Pulse: 95  Temp: 36.7 C (98.1 F)  SpO2: 99%  Weight: 68.2 kg (150 lb 6.4 oz)  Height: 147.3 cm (4' 10)  PainSc: 0-No pain  PainLoc: Breast   Body mass index is 31.43 kg/m.  Physical Exam   She appears well on exam.  There are no palpable masses in either breast. There is no axillary  adenopathy on either side. The nipple and areolar complexes appear normal.  Labs, Imaging and Diagnostic Testing: I have reviewed her mammograms, ultrasound, and pathology results  Assessment and Plan:   Diagnoses and all orders for this visit:  Invasive ductal carcinoma of breast, left (CMS/HHS-HCC) - Ambulatory Referral to Radiation Oncology - Ambulatory Referral to Oncology-Medical - Ambulatory Referral to Physical Therapy - Ambulatory Referral to Cancer Genetics - [id number]   I gave her and her mother-in-law a copy of the pathology results we discussed breast cancer in detail. We discussed the multidisciplinary approach to cancer. From a surgical standpoint we discussed breast conservation versus mastectomy. She is definitely interested in breast conservation. I next discussed proceeding with a radioactive seed guided left breast lumpectomy and sentinel lymph node biopsy. I explained the surgical procedure in detail as well as the need to remove some lymph nodes for evaluation despite the normal ultrasound. We discussed the risks which includes but is not limited to bleeding, infection, injury to surrounding structures, the need for further procedures if the lymph nodes or margins are positive, the eventual potential need for a Port-A-Cath, cardiopulmonary issues with anesthesia, DVT, etc. Again, she will be referred to the cancer center to see medical and radiation oncology as well as genetics and physical therapy. I discussed the reasons for this with her in detail as well. At this point, she understands and agrees with the current plans. Surgery will be scheduled

## 2024-01-06 ENCOUNTER — Encounter (HOSPITAL_BASED_OUTPATIENT_CLINIC_OR_DEPARTMENT_OTHER): Payer: Self-pay | Admitting: Surgery

## 2024-01-06 ENCOUNTER — Other Ambulatory Visit: Payer: Self-pay

## 2024-01-06 ENCOUNTER — Ambulatory Visit (HOSPITAL_BASED_OUTPATIENT_CLINIC_OR_DEPARTMENT_OTHER): Admitting: Certified Registered"

## 2024-01-06 ENCOUNTER — Encounter (HOSPITAL_BASED_OUTPATIENT_CLINIC_OR_DEPARTMENT_OTHER)
Admission: RE | Disposition: A | Discharge status: Home / Self Care | Payer: Self-pay | Source: Home / Self Care | Attending: Surgery

## 2024-01-06 ENCOUNTER — Ambulatory Visit (HOSPITAL_BASED_OUTPATIENT_CLINIC_OR_DEPARTMENT_OTHER)
Admission: RE | Admit: 2024-01-06 | Discharge: 2024-01-06 | Disposition: A | Discharge status: Home / Self Care | Attending: Surgery | Admitting: Surgery

## 2024-01-06 ENCOUNTER — Ambulatory Visit
Admission: RE | Admit: 2024-01-06 | Discharge: 2024-01-06 | Disposition: A | Discharge status: Home / Self Care | Source: Ambulatory Visit | Attending: Surgery | Admitting: Surgery

## 2024-01-06 DIAGNOSIS — C50412 Malignant neoplasm of upper-outer quadrant of left female breast: Secondary | ICD-10-CM | POA: Insufficient documentation

## 2024-01-06 DIAGNOSIS — N6489 Other specified disorders of breast: Secondary | ICD-10-CM | POA: Diagnosis not present

## 2024-01-06 DIAGNOSIS — Z90710 Acquired absence of both cervix and uterus: Secondary | ICD-10-CM | POA: Insufficient documentation

## 2024-01-06 DIAGNOSIS — Z8041 Family history of malignant neoplasm of ovary: Secondary | ICD-10-CM | POA: Diagnosis not present

## 2024-01-06 DIAGNOSIS — C50912 Malignant neoplasm of unspecified site of left female breast: Secondary | ICD-10-CM | POA: Diagnosis not present

## 2024-01-06 DIAGNOSIS — K219 Gastro-esophageal reflux disease without esophagitis: Secondary | ICD-10-CM | POA: Diagnosis not present

## 2024-01-06 DIAGNOSIS — Z1722 Progesterone receptor negative status: Secondary | ICD-10-CM | POA: Diagnosis not present

## 2024-01-06 DIAGNOSIS — F418 Other specified anxiety disorders: Secondary | ICD-10-CM | POA: Diagnosis not present

## 2024-01-06 DIAGNOSIS — F419 Anxiety disorder, unspecified: Secondary | ICD-10-CM | POA: Diagnosis not present

## 2024-01-06 DIAGNOSIS — Z170 Estrogen receptor positive status [ER+]: Secondary | ICD-10-CM | POA: Insufficient documentation

## 2024-01-06 DIAGNOSIS — Z1732 Human epidermal growth factor receptor 2 negative status: Secondary | ICD-10-CM | POA: Insufficient documentation

## 2024-01-06 DIAGNOSIS — I10 Essential (primary) hypertension: Secondary | ICD-10-CM | POA: Insufficient documentation

## 2024-01-06 DIAGNOSIS — Z01818 Encounter for other preprocedural examination: Secondary | ICD-10-CM

## 2024-01-06 DIAGNOSIS — G8918 Other acute postprocedural pain: Secondary | ICD-10-CM | POA: Diagnosis not present

## 2024-01-06 DIAGNOSIS — F32A Depression, unspecified: Secondary | ICD-10-CM | POA: Diagnosis not present

## 2024-01-06 DIAGNOSIS — Z853 Personal history of malignant neoplasm of breast: Secondary | ICD-10-CM

## 2024-01-06 HISTORY — PX: BREAST LUMPECTOMY WITH RADIOACTIVE SEED AND SENTINEL LYMPH NODE BIOPSY: SHX6550

## 2024-01-06 SURGERY — BREAST LUMPECTOMY WITH RADIOACTIVE SEED AND SENTINEL LYMPH NODE BIOPSY
Anesthesia: General | Site: Breast | Laterality: Left

## 2024-01-06 MED ORDER — PROPOFOL 10 MG/ML IV BOLUS
INTRAVENOUS | Status: AC
  Filled 2024-01-06: qty 20

## 2024-01-06 MED ORDER — PHENYLEPHRINE HCL (PRESSORS) 10 MG/ML IV SOLN
INTRAVENOUS | Status: DC | PRN
  Administered 2024-01-06: 80 ug via INTRAVENOUS

## 2024-01-06 MED ORDER — OXYCODONE HCL 5 MG/5ML PO SOLN: 5.0000 mg | Freq: Once | ORAL | Status: DC | PRN

## 2024-01-06 MED ORDER — CEFAZOLIN SODIUM-DEXTROSE 2-4 GM/100ML-% IV SOLN
INTRAVENOUS | Status: AC
Start: 2024-01-06 — End: 2024-01-06
  Filled 2024-01-06: qty 100

## 2024-01-06 MED ORDER — FENTANYL CITRATE (PF) 100 MCG/2ML IJ SOLN
INTRAMUSCULAR | Status: AC
  Filled 2024-01-06: qty 2

## 2024-01-06 MED ORDER — ROPIVACAINE HCL 5 MG/ML IJ SOLN
INTRAMUSCULAR | Status: DC | PRN
  Administered 2024-01-06: 30 mL via PERINEURAL

## 2024-01-06 MED ORDER — HYDROMORPHONE HCL 1 MG/ML IJ SOLN
INTRAMUSCULAR | Status: AC
Start: 2024-01-06 — End: 2024-01-06
  Filled 2024-01-06: qty 0.5

## 2024-01-06 MED ORDER — OXYCODONE HCL 5 MG PO TABS: 5.0000 mg | ORAL_TABLET | Freq: Once | ORAL | Status: DC | PRN

## 2024-01-06 MED ORDER — AMISULPRIDE (ANTIEMETIC) 5 MG/2ML IV SOLN: 10.0000 mg | Freq: Once | INTRAVENOUS | Status: DC | PRN

## 2024-01-06 MED ORDER — ACETAMINOPHEN 500 MG PO TABS
1000.0000 mg | ORAL_TABLET | ORAL | Status: AC
  Administered 2024-01-06: 1000 mg via ORAL

## 2024-01-06 MED ORDER — ONDANSETRON HCL 4 MG/2ML IJ SOLN
INTRAMUSCULAR | Status: AC
  Filled 2024-01-06: qty 2

## 2024-01-06 MED ORDER — OXYCODONE HCL 5 MG PO TABS: 5.0000 mg | ORAL_TABLET | Freq: Four times a day (QID) | ORAL | 0 refills | Status: AC | PRN

## 2024-01-06 MED ORDER — LIDOCAINE 2% (20 MG/ML) 5 ML SYRINGE
INTRAMUSCULAR | Status: AC
  Filled 2024-01-06: qty 5

## 2024-01-06 MED ORDER — CEFAZOLIN SODIUM-DEXTROSE 2-4 GM/100ML-% IV SOLN
2.0000 g | INTRAVENOUS | Status: AC
  Administered 2024-01-06: 2 g via INTRAVENOUS

## 2024-01-06 MED ORDER — FENTANYL CITRATE (PF) 100 MCG/2ML IJ SOLN
100.0000 ug | Freq: Once | INTRAMUSCULAR | Status: AC
  Administered 2024-01-06: 100 ug via INTRAVENOUS

## 2024-01-06 MED ORDER — DEXAMETHASONE SODIUM PHOSPHATE 10 MG/ML IJ SOLN
INTRAMUSCULAR | Status: DC | PRN
  Administered 2024-01-06: 10 mg via INTRAVENOUS

## 2024-01-06 MED ORDER — ONDANSETRON HCL 4 MG/2ML IJ SOLN
INTRAMUSCULAR | Status: DC | PRN
  Administered 2024-01-06: 4 mg via INTRAVENOUS

## 2024-01-06 MED ORDER — SODIUM CHLORIDE 0.9 % IV SOLN
12.5000 mg | INTRAVENOUS | Status: DC | PRN
  Filled 2024-01-06: qty 0.5

## 2024-01-06 MED ORDER — HYDROMORPHONE HCL 1 MG/ML IJ SOLN
0.2500 mg | INTRAMUSCULAR | Status: DC | PRN
  Administered 2024-01-06 (×2): 0.5 mg via INTRAVENOUS
  Administered 2024-01-06: 0.25 mg via INTRAVENOUS

## 2024-01-06 MED ORDER — MIDAZOLAM HCL 2 MG/2ML IJ SOLN
INTRAMUSCULAR | Status: AC
  Filled 2024-01-06: qty 2

## 2024-01-06 MED ORDER — LIDOCAINE HCL (CARDIAC) PF 100 MG/5ML IV SOSY
PREFILLED_SYRINGE | INTRAVENOUS | Status: DC | PRN
  Administered 2024-01-06: 40 mg via INTRAVENOUS

## 2024-01-06 MED ORDER — PHENYLEPHRINE 80 MCG/ML (10ML) SYRINGE FOR IV PUSH (FOR BLOOD PRESSURE SUPPORT)
PREFILLED_SYRINGE | INTRAVENOUS | Status: AC
  Filled 2024-01-06: qty 10

## 2024-01-06 MED ORDER — FENTANYL CITRATE (PF) 100 MCG/2ML IJ SOLN
INTRAMUSCULAR | Status: DC | PRN
  Administered 2024-01-06 (×2): 25 ug via INTRAVENOUS

## 2024-01-06 MED ORDER — MEPERIDINE HCL 25 MG/ML IJ SOLN: 6.2500 mg | INTRAMUSCULAR | Status: DC | PRN

## 2024-01-06 MED ORDER — LACTATED RINGERS IV SOLN: INTRAVENOUS | Status: DC

## 2024-01-06 MED ORDER — MAGTRACE LYMPHATIC TRACER
INTRAMUSCULAR | Status: DC | PRN
  Administered 2024-01-06: 1.5 mL via INTRAMUSCULAR

## 2024-01-06 MED ORDER — EPHEDRINE SULFATE (PRESSORS) 50 MG/ML IJ SOLN
INTRAMUSCULAR | Status: DC | PRN
Start: 2024-01-06 — End: 2024-01-06
  Administered 2024-01-06 (×2): 5 mg via INTRAVENOUS
  Administered 2024-01-06: 10 mg via INTRAVENOUS
  Administered 2024-01-06: 5 mg via INTRAVENOUS

## 2024-01-06 MED ORDER — DEXAMETHASONE SODIUM PHOSPHATE 10 MG/ML IJ SOLN
INTRAMUSCULAR | Status: AC
  Filled 2024-01-06: qty 1

## 2024-01-06 MED ORDER — BUPIVACAINE-EPINEPHRINE (PF) 0.5% -1:200000 IJ SOLN
INTRAMUSCULAR | Status: AC
  Filled 2024-01-06: qty 30

## 2024-01-06 MED ORDER — EPHEDRINE 5 MG/ML INJ
INTRAVENOUS | Status: AC
Start: 2024-01-06 — End: 2024-01-06
  Filled 2024-01-06: qty 5

## 2024-01-06 MED ORDER — MIDAZOLAM HCL 2 MG/2ML IJ SOLN
2.0000 mg | Freq: Once | INTRAMUSCULAR | Status: AC
  Administered 2024-01-06: 2 mg via INTRAVENOUS

## 2024-01-06 MED ORDER — BUPIVACAINE-EPINEPHRINE 0.5% -1:200000 IJ SOLN
INTRAMUSCULAR | Status: DC | PRN
  Administered 2024-01-06: 20 mL

## 2024-01-06 MED ORDER — ACETAMINOPHEN 500 MG PO TABS
ORAL_TABLET | ORAL | Status: AC
  Filled 2024-01-06: qty 2

## 2024-01-06 MED ORDER — PROPOFOL 10 MG/ML IV BOLUS
INTRAVENOUS | Status: DC | PRN
  Administered 2024-01-06: 200 mg via INTRAVENOUS

## 2024-01-06 SURGICAL SUPPLY — 37 items
BLADE SURG 15 STRL LF DISP TIS (BLADE) ×1 IMPLANT
CANISTER SUCT 1200ML W/VALVE (MISCELLANEOUS) IMPLANT
CHLORAPREP W/TINT 26 (MISCELLANEOUS) ×1 IMPLANT
CLIP APPLIE 9.375 MED OPEN (MISCELLANEOUS) ×1 IMPLANT
CLIP TI WIDE RED SMALL 6 (CLIP) IMPLANT
COVER BACK TABLE 60X90IN (DRAPES) ×1 IMPLANT
COVER MAYO STAND STRL (DRAPES) ×1 IMPLANT
COVER PROBE CYLINDRICAL 5X96 (MISCELLANEOUS) ×1 IMPLANT
DERMABOND ADVANCED .7 DNX12 (GAUZE/BANDAGES/DRESSINGS) ×1 IMPLANT
DRAPE LAPAROSCOPIC ABDOMINAL (DRAPES) ×1 IMPLANT
DRAPE UTILITY XL STRL (DRAPES) ×1 IMPLANT
ELECTRODE REM PT RTRN 9FT ADLT (ELECTROSURGICAL) ×1 IMPLANT
GAUZE SPONGE 4X4 12PLY STRL LF (GAUZE/BANDAGES/DRESSINGS) IMPLANT
GLOVE BIOGEL PI IND STRL 7.0 (GLOVE) IMPLANT
GLOVE SURG SS PI 7.0 STRL IVOR (GLOVE) IMPLANT
GLOVE SURG SYN 7.5 PF PI (GLOVE) IMPLANT
GOWN STRL REUS W/ TWL LRG LVL3 (GOWN DISPOSABLE) ×1 IMPLANT
GOWN STRL REUS W/ TWL XL LVL3 (GOWN DISPOSABLE) ×1 IMPLANT
KIT MARKER MARGIN INK (KITS) ×1 IMPLANT
NDL HYPO 25X1 1.5 SAFETY (NEEDLE) ×1 IMPLANT
NDL SAFETY ECLIPSE 18X1.5 (NEEDLE) ×1 IMPLANT
NEEDLE HYPO 25X1 1.5 SAFETY (NEEDLE) ×1 IMPLANT
NS IRRIG 1000ML POUR BTL (IV SOLUTION) ×1 IMPLANT
PACK BASIN DAY SURGERY FS (CUSTOM PROCEDURE TRAY) ×1 IMPLANT
PENCIL SMOKE EVACUATOR (MISCELLANEOUS) ×1 IMPLANT
SLEEVE SCD COMPRESS KNEE MED (STOCKING) ×1 IMPLANT
SPIKE FLUID TRANSFER (MISCELLANEOUS) IMPLANT
SPONGE T-LAP 4X18 ~~LOC~~+RFID (SPONGE) ×1 IMPLANT
SUT MNCRL AB 4-0 PS2 18 (SUTURE) ×1 IMPLANT
SUT SILK 2 0 SH (SUTURE) IMPLANT
SUT VIC AB 3-0 SH 27X BRD (SUTURE) ×1 IMPLANT
SYR CONTROL 10ML LL (SYRINGE) ×1 IMPLANT
TOWEL GREEN STERILE FF (TOWEL DISPOSABLE) ×1 IMPLANT
TRACER MAGTRACE VIAL (MISCELLANEOUS) IMPLANT
TRAY FAXITRON CT DISP (TRAY / TRAY PROCEDURE) ×1 IMPLANT
TUBE CONNECTING 20X1/4 (TUBING) IMPLANT
YANKAUER SUCT BULB TIP NO VENT (SUCTIONS) IMPLANT

## 2024-01-06 NOTE — Anesthesia Postprocedure Evaluation (Signed)
 Anesthesia Post Note  Patient: Carla Jackson  Procedure(s) Performed: BREAST LUMPECTOMY WITH RADIOACTIVE SEED AND SENTINEL LYMPH NODE BIOPSY (Left: Breast)     Patient location during evaluation: PACU Anesthesia Type: General Level of consciousness: awake and alert Pain management: pain level controlled Vital Signs Assessment: post-procedure vital signs reviewed and stable Respiratory status: spontaneous breathing, nonlabored ventilation and respiratory function stable Cardiovascular status: blood pressure returned to baseline and stable Postop Assessment: no apparent nausea or vomiting Anesthetic complications: no   No notable events documented.  Last Vitals:  Vitals:   01/06/24 1030 01/06/24 1046  BP: (!) 143/83 139/72  Pulse: 97 90  Resp: 19 16  Temp:  (!) 36.4 C  SpO2: 98% 95%    Last Pain:  Vitals:   01/06/24 1046  TempSrc: Temporal  PainSc: 0-No pain                 Butler Levander Pinal

## 2024-01-06 NOTE — Interval H&P Note (Signed)
 History and Physical Interval Note: no change in H and P  01/06/2024 7:14 AM  [name]  has presented today for surgery, with the diagnosis of LEFT BREAST CANCER.  The various methods of treatment have been discussed with the patient and family. After consideration of risks, benefits and other options for treatment, the patient has consented to  Procedure(s) with comments: BREAST LUMPECTOMY WITH RADIOACTIVE SEED AND SENTINEL LYMPH NODE BIOPSY (Left) - LMA w/PEC BLOCK as a surgical intervention.  The patient's history has been reviewed, patient examined, no change in status, stable for surgery.  I have reviewed the patient's chart and labs.  Questions were answered to the patient's satisfaction.     Carla Jackson

## 2024-01-06 NOTE — Transfer of Care (Signed)
 Immediate Anesthesia Transfer of Care Note  Patient: Normagene Harvie  Procedure(s) Performed: BREAST LUMPECTOMY WITH RADIOACTIVE SEED AND SENTINEL LYMPH NODE BIOPSY (Left: Breast)  Patient Location: PACU  Anesthesia Type:GA combined with regional for post-op pain  Level of Consciousness: drowsy  Airway & Oxygen Therapy: Patient Spontanous Breathing and Patient connected to face mask oxygen  Post-op Assessment: Report given to RN and Post -op Vital signs reviewed and stable  Post vital signs: Reviewed and stable  Last Vitals:  Vitals Value Taken Time  BP 153/92 01/06/24 09:48  Temp    Pulse 88 01/06/24 09:48  Resp 18 01/06/24 09:48  SpO2 100 % 01/06/24 09:48  Vitals shown include unfiled device data.  Last Pain:  Vitals:   01/06/24 0659  TempSrc: Temporal  PainSc: 0-No pain         Complications: No notable events documented.

## 2024-01-06 NOTE — Anesthesia Procedure Notes (Signed)
 Anesthesia Regional Block: Pectoralis block   Pre-Anesthetic Checklist: , timeout performed,  Correct Patient, Correct Site, Correct Laterality,  Correct Procedure, Correct Position, site marked,  Risks and benefits discussed,  Surgical consent,  Pre-op evaluation,  At surgeon's request and post-op pain management  Laterality: Left  Prep: chloraprep       Needles:  Injection technique: Single-shot  Needle Type: Stimiplex     Needle Length: 9cm  Needle Gauge: 21     Additional Needles:   Procedures:,,,, ultrasound used (permanent image in chart),,    Narrative:  Start time: 01/06/2024 8:03 AM End time: 01/06/2024 8:08 AM Injection made incrementally with aspirations every 5 mL.  Performed by: Personally  Anesthesiologist: Cleotilde Butler Dade, MD

## 2024-01-06 NOTE — Anesthesia Procedure Notes (Signed)
 Procedure Name: LMA Insertion Date/Time: 01/06/2024 8:45 AM  Performed by: Debarah Chiquita LABOR, CRNAPre-anesthesia Checklist: Patient identified, Emergency Drugs available, Suction available and Patient being monitored Patient Re-evaluated:Patient Re-evaluated prior to induction Oxygen Delivery Method: Circle system utilized Preoxygenation: Pre-oxygenation with 100% oxygen Induction Type: IV induction Ventilation: Mask ventilation without difficulty LMA: LMA inserted LMA Size: 3.0 Number of attempts: 1 Airway Equipment and Method: Bite block Placement Confirmation: positive ETCO2 Tube secured with: Tape Dental Injury: Teeth and Oropharynx as per pre-operative assessment

## 2024-01-06 NOTE — Discharge Instructions (Addendum)
 [facility] Office Phone Number [phone]  BREAST BIOPSY/ PARTIAL MASTECTOMY: POST OP INSTRUCTIONS  Always review your discharge instruction sheet given to you by the facility where your surgery was performed.  IF YOU HAVE DISABILITY OR FAMILY LEAVE FORMS, YOU MUST BRING THEM TO THE OFFICE FOR PROCESSING.  DO NOT GIVE THEM TO YOUR DOCTOR.  A prescription for pain medication may be given to you upon discharge.  Take your pain medication as prescribed, if needed.  If narcotic pain medicine is not needed, then you may take acetaminophen  (Tylenol ) or ibuprofen  (Advil ) as needed. Take your usually prescribed medications unless otherwise directed If you need a refill on your pain medication, please contact your pharmacy.  They will contact our office to request authorization.  Prescriptions will not be filled after 5pm or on week-ends. You should eat very light the first 24 hours after surgery, such as soup, crackers, pudding, etc.  Resume your normal diet the day after surgery. Most patients will experience some swelling and bruising in the breast.  Ice packs and a good support bra will help.  Swelling and bruising can take several days to resolve.  It is common to experience some constipation if taking pain medication after surgery.  Increasing fluid intake and taking a stool softener will usually help or prevent this problem from occurring.  A mild laxative (Milk of Magnesia or Miralax) should be taken according to package directions if there are no bowel movements after 48 hours. Unless discharge instructions indicate otherwise, you may remove your bandages 24-48 hours after surgery, and you may shower at that time.  You may have steri-strips (small skin tapes) in place directly over the incision.  These strips should be left on the skin for 7-10 days.  If your surgeon used skin glue on the incision, you may shower in 24 hours.  The glue will flake off over the next 2-3 weeks.  Any  sutures or staples will be removed at the office during your follow-up visit. ACTIVITIES:  You may resume regular daily activities (gradually increasing) beginning the next day.  Wearing a good support bra or sports bra minimizes pain and swelling.  You may have sexual intercourse when it is comfortable. You may drive when you no longer are taking prescription pain medication, you can comfortably wear a seatbelt, and you can safely maneuver your car and apply brakes. RETURN TO WORK:  ______________________________________________________________________________________ Rosine should see your doctor in the office for a follow-up appointment approximately two weeks after your surgery.  Your doctor's nurse will typically make your follow-up appointment when she calls you with your pathology report.  Expect your pathology report 2-3 business days after your surgery.  You may call to check if you do not hear from us  after three days. OTHER INSTRUCTIONS: YOU MAY REMOVE THE BINDER AND SHOWER STARTING TOMORROW AND THEN WEAR THE BINDER OR A SPORTS BRA FOR THE NEXT 3-5 DAYS ICE PACK, TYLENOL , AND IBUPROFEN  ALSO FOR PAIN NO VIGOROUS ACTIVITY FOR ONE WEEK_______________________________________________________________________________________________ _____________________________________________________________________________________________________________________________________ _____________________________________________________________________________________________________________________________________ _____________________________________________________________________________________________________________________________________  WHEN TO CALL YOUR DOCTOR: Fever over 101.0 Nausea and/or vomiting. Extreme swelling or bruising. Continued bleeding from incision. Increased pain, redness, or drainage from the incision.  The clinic staff is available to answer your questions during regular business hours.   Please don't hesitate to call and ask to speak to one of the nurses for clinical concerns.  If you have a medical emergency, go to the nearest emergency room or call 911.  A surgeon  from [facility] is always on call at the hospital.  For further questions, please visit [email or web address]   No Tylenol  before 1pm today.  Post Anesthesia Home Care Instructions  Activity: Get plenty of rest for the remainder of the day. A responsible individual must stay with you for 24 hours following the procedure.  For the next 24 hours, DO NOT: -Drive a car -Advertising copywriter -Drink alcoholic beverages -Take any medication unless instructed by your physician -Make any legal decisions or sign important papers.  Meals: Start with liquid foods such as gelatin or soup. Progress to regular foods as tolerated. Avoid greasy, spicy, heavy foods. If nausea and/or vomiting occur, drink only clear liquids until the nausea and/or vomiting subsides. Call your physician if vomiting continues.  Special Instructions/Symptoms: Your throat may feel dry or sore from the anesthesia or the breathing tube placed in your throat during surgery. If this causes discomfort, gargle with warm salt water. The discomfort should disappear within 24 hours.  If you had a scopolamine patch placed behind your ear for the management of post- operative nausea and/or vomiting:  1. The medication in the patch is effective for 72 hours, after which it should be removed.  Wrap patch in a tissue and discard in the trash. Wash hands thoroughly with soap and water. 2. You may remove the patch earlier than 72 hours if you experience unpleasant side effects which may include dry mouth, dizziness or visual disturbances. 3. Avoid touching the patch. Wash your hands with soap and water after contact with the patch.

## 2024-01-06 NOTE — Anesthesia Preprocedure Evaluation (Signed)
 Anesthesia Evaluation    Airway Mallampati: II  TM Distance: >3 FB Neck ROM: Full    Dental  (+) Dental Advisory Given   Pulmonary    Pulmonary exam normal breath sounds clear to auscultation       Cardiovascular hypertension, Normal cardiovascular exam Rhythm:Regular Rate:Normal     Neuro/Psych   Anxiety Depression       GI/Hepatic ,GERD  ,,  Endo/Other    Renal/GU      Musculoskeletal   Abdominal  (+) + obese  Peds  Hematology   Anesthesia Other Findings Breast Cancer  Reproductive/Obstetrics                              Anesthesia Physical Anesthesia Plan  ASA: 3  Anesthesia Plan: General   Post-op Pain Management: Regional block*   Induction: Intravenous  PONV Risk Score and Plan: 3 and Ondansetron , Dexamethasone , Midazolam  and Treatment may vary due to age or medical condition  Airway Management Planned: LMA  Additional Equipment:   Intra-op Plan:   Post-operative Plan: Extubation in OR  Informed Consent: I have reviewed the patients History and Physical, chart, labs and discussed the procedure including the risks, benefits and alternatives for the proposed anesthesia with the patient or authorized representative who has indicated his/her understanding and acceptance.     Dental advisory given  Plan Discussed with: CRNA  Anesthesia Plan Comments:         Anesthesia Quick Evaluation

## 2024-01-06 NOTE — Progress Notes (Signed)
 Assisted Dr. Hyacinth Meeker with left, pectoralis, ultrasound guided block. Side rails up, monitors on throughout procedure. See vital signs in flow sheet. Tolerated Procedure well.

## 2024-01-06 NOTE — Op Note (Signed)
   Carla Jackson 01/06/2024   Pre-op Diagnosis: LEFT BREAST CANCER     Post-op Diagnosis: SAME  Procedure(s): RADIOACTIVE SEED GUIDED LEFT BREAST LUMPECTOMY DEEP LEFT AXILLARY SENTINEL LYMPH NODE BIOPSY INJECTION OF MAG TRACE FOR LYMPH NODE MAPPING  Surgeon(s): Vernetta Berg, MD  Anesthesia: General  Staff:  Circulator: Lelon Daphne BROCKS, RN Relief Circulator: Lenon Sonny BROCKS, RN Scrub Person: Wilmon Antonio SQUIBB, RN; Eliberto Geroge HERO, RN  Estimated Blood Loss: Minimal               Specimens: sent to path  Indications: This is a 43 year old female found to have a left breast mass on screen mammography at approximate 1 o'clock position of the left breast.  A biopsy showed invasive ductal carcinoma.  The decision was made to proceed with a radioactive seed guided left breast lumpectomy and sentinel lymph node biopsy  Procedure: The patient was brought to the operating room and identified the correct patient.  She was placed upon the operating table general anesthesia was induced.  I injected mag trace under sterile technique underneath the left nipple areolar complex and massaged the breast.  Her left breast and axilla were then prepped and draped in usual sterile fashion.  Using the neoprobe I located the radioactive seed which is in the upper outer quadrant of the left breast near the 12 o'clock position.  I anesthetized the skin in the upper outer quadrant of the axilla with Marcaine  and then made a longitudinal incision with scalpel.  I then dissected down to the breast tissue with electrocautery.  I then dissected medially with the aid of the neoprobe toward the radioactive seed which was deep in the breast tissue.  Once I got medial to the signal I then went down to the chest wall.  I then completed the lumpectomy staying widely around the signal taking all the tissue off of the chest wall.  I then completed lumpectomy laterally and removed the lumpectomy specimen.  I marked all  margins with paint.  An x-ray was performed and specimen confirmed that the previous biopsy clip and radioactive seed were in the specimen.  The specimen was then sent to pathology for evaluation.  Through the same incision I then tunneled down into the deep left axilla and with the aid of the mag trace probe I was able to identify the sentinel lymph nodes.  These were grouped together and I elevated them with an Allis clamp and excised the lymph nodes with the surrounding fat using the cautery and surgical clips.  Once the sentinel lymph nodes were removed I examined the axilla and found no other enlarged lymph nodes and there was no further uptake of the mag trace.  Hemostasis peer to be achieved.  I then closed the axillary space with a single 3-0 Vicryl suture.  I then anesthetized the lumpectomy site further with Marcaine .  I placed surgical clips around the periphery of the lumpectomy cavity.  I then closed the incision with interrupted 3-0 Vicryl sutures and a running 4-0 Monocryl.  Dermabond was then applied.  The patient tolerated the procedure well.  All the counts were correct at the end of the procedure.  The patient was then extubated in the operating room and taken in a stable condition to the recovery room.          Berg Vernetta   Date: 01/06/2024  Time: 9:35 AM

## 2024-01-07 ENCOUNTER — Encounter (HOSPITAL_BASED_OUTPATIENT_CLINIC_OR_DEPARTMENT_OTHER): Payer: Self-pay | Admitting: Surgery

## 2024-01-07 LAB — SURGICAL PATHOLOGY

## 2024-02-01 DIAGNOSIS — Z170 Estrogen receptor positive status [ER+]: Secondary | ICD-10-CM | POA: Diagnosis not present

## 2024-02-01 DIAGNOSIS — C50412 Malignant neoplasm of upper-outer quadrant of left female breast: Secondary | ICD-10-CM | POA: Diagnosis not present

## 2024-02-02 ENCOUNTER — Encounter (HOSPITAL_COMMUNITY): Payer: Self-pay

## 2024-02-04 DIAGNOSIS — Z170 Estrogen receptor positive status [ER+]: Secondary | ICD-10-CM | POA: Diagnosis not present

## 2024-02-04 DIAGNOSIS — C50412 Malignant neoplasm of upper-outer quadrant of left female breast: Secondary | ICD-10-CM | POA: Diagnosis not present

## 2024-02-09 DIAGNOSIS — Z170 Estrogen receptor positive status [ER+]: Secondary | ICD-10-CM | POA: Diagnosis not present

## 2024-02-09 DIAGNOSIS — Z5111 Encounter for antineoplastic chemotherapy: Secondary | ICD-10-CM | POA: Diagnosis not present

## 2024-02-09 DIAGNOSIS — C50412 Malignant neoplasm of upper-outer quadrant of left female breast: Secondary | ICD-10-CM | POA: Diagnosis not present

## 2024-02-10 DIAGNOSIS — Z170 Estrogen receptor positive status [ER+]: Secondary | ICD-10-CM | POA: Diagnosis not present

## 2024-02-10 DIAGNOSIS — C50412 Malignant neoplasm of upper-outer quadrant of left female breast: Secondary | ICD-10-CM | POA: Diagnosis not present

## 2024-02-11 DIAGNOSIS — Z5111 Encounter for antineoplastic chemotherapy: Secondary | ICD-10-CM | POA: Diagnosis not present

## 2024-02-11 DIAGNOSIS — Z170 Estrogen receptor positive status [ER+]: Secondary | ICD-10-CM | POA: Diagnosis not present

## 2024-02-11 DIAGNOSIS — C50412 Malignant neoplasm of upper-outer quadrant of left female breast: Secondary | ICD-10-CM | POA: Diagnosis not present

## 2024-03-03 DIAGNOSIS — C50412 Malignant neoplasm of upper-outer quadrant of left female breast: Secondary | ICD-10-CM | POA: Diagnosis not present

## 2024-03-03 DIAGNOSIS — Z5111 Encounter for antineoplastic chemotherapy: Secondary | ICD-10-CM | POA: Diagnosis not present

## 2024-03-03 DIAGNOSIS — Z170 Estrogen receptor positive status [ER+]: Secondary | ICD-10-CM | POA: Diagnosis not present

## 2024-03-22 ENCOUNTER — Encounter (HOSPITAL_COMMUNITY): Payer: Self-pay

## 2024-03-22 DIAGNOSIS — C50412 Malignant neoplasm of upper-outer quadrant of left female breast: Secondary | ICD-10-CM | POA: Diagnosis not present
